# Patient Record
Sex: Male | Born: 1990 | Race: Black or African American | Hispanic: No | Marital: Single | Smoking: Former smoker
Health system: Southern US, Community
[De-identification: ages and names within clinical notes are randomized; demographics above are authoritative.]

## PROBLEM LIST (undated history)

## (undated) DIAGNOSIS — F32A Depression, unspecified: Secondary | ICD-10-CM

## (undated) DIAGNOSIS — R7303 Prediabetes: Secondary | ICD-10-CM

## (undated) DIAGNOSIS — R519 Headache, unspecified: Secondary | ICD-10-CM

## (undated) DIAGNOSIS — F319 Bipolar disorder, unspecified: Secondary | ICD-10-CM

## (undated) DIAGNOSIS — I10 Essential (primary) hypertension: Secondary | ICD-10-CM

## (undated) DIAGNOSIS — F988 Other specified behavioral and emotional disorders with onset usually occurring in childhood and adolescence: Secondary | ICD-10-CM

## (undated) DIAGNOSIS — R569 Unspecified convulsions: Secondary | ICD-10-CM

## (undated) DIAGNOSIS — F329 Major depressive disorder, single episode, unspecified: Secondary | ICD-10-CM

---
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
## 2009-10-15 ENCOUNTER — Emergency Department (HOSPITAL_BASED_OUTPATIENT_CLINIC_OR_DEPARTMENT_OTHER): Admission: EM | Admit: 2009-10-15 | Discharge: 2009-10-15 | Payer: Self-pay | Admitting: Emergency Medicine

## 2009-11-15 ENCOUNTER — Emergency Department (HOSPITAL_COMMUNITY): Admission: EM | Admit: 2009-11-15 | Discharge: 2009-11-16 | Payer: Self-pay | Admitting: Emergency Medicine

## 2009-11-18 ENCOUNTER — Emergency Department (HOSPITAL_COMMUNITY): Admission: AC | Admit: 2009-11-18 | Discharge: 2009-11-19 | Payer: Self-pay

## 2010-07-16 LAB — URINALYSIS, ROUTINE W REFLEX MICROSCOPIC
Glucose, UA: NEGATIVE mg/dL
Hgb urine dipstick: NEGATIVE
Ketones, ur: 15 mg/dL — AB
Protein, ur: 30 mg/dL — AB
Specific Gravity, Urine: 1.039 — ABNORMAL HIGH (ref 1.005–1.030)
pH: 6.5 (ref 5.0–8.0)

## 2010-07-16 LAB — CBC
HCT: 38.5 % — ABNORMAL LOW (ref 39.0–52.0)
Hemoglobin: 13.7 g/dL (ref 13.0–17.0)
MCH: 31.8 pg (ref 26.0–34.0)
MCV: 92 fL (ref 78.0–100.0)
MCV: 93.2 fL (ref 78.0–100.0)
Platelets: 109 10*3/uL — ABNORMAL LOW (ref 150–400)
RBC: 4.13 MIL/uL — ABNORMAL LOW (ref 4.22–5.81)
RDW: 14.9 % (ref 11.5–15.5)
WBC: 3.2 10*3/uL — ABNORMAL LOW (ref 4.0–10.5)
WBC: 3.9 10*3/uL — ABNORMAL LOW (ref 4.0–10.5)

## 2010-07-16 LAB — BASIC METABOLIC PANEL
BUN: 13 mg/dL (ref 6–23)
CO2: 28 mEq/L (ref 19–32)
Calcium: 9.7 mg/dL (ref 8.4–10.5)
Calcium: 9.5 mg/dL (ref 8.4–10.5)
Creatinine, Ser: 0.95 mg/dL (ref 0.4–1.5)
GFR calc Af Amer: >60 mL/min (ref >60)
GFR calc non Af Amer: >60 mL/min (ref >60)
GFR calc non Af Amer: >60 mL/min (ref >60)
Glucose, Bld: 94 mg/dL (ref 70–99)
Potassium: 4.6 mEq/L (ref 3.5–5.1)
Sodium: 141 mEq/L (ref 135–145)

## 2010-07-16 LAB — DIFFERENTIAL
Basophils Absolute: 0 10*3/uL (ref 0.0–0.1)
Basophils Relative: 0 % (ref 0–1)
Eosinophils Absolute: 0.1 10*3/uL (ref 0.0–0.7)
Eosinophils Absolute: 0.2 10*3/uL (ref 0.0–0.7)
Eosinophils Relative: 4 % (ref 0–5)
Lymphocytes Relative: 39 % (ref 12–46)
Monocytes Absolute: 0.3 10*3/uL (ref 0.1–1.0)
Monocytes Relative: 12 % (ref 3–12)
Neutro Abs: 1.5 10*3/uL — ABNORMAL LOW (ref 1.7–7.7)
Neutro Abs: 1.7 10*3/uL (ref 1.7–7.7)
Neutrophils Relative %: 44 % (ref 43–77)

## 2010-07-16 LAB — RAPID URINE DRUG SCREEN, HOSP PERFORMED
Amphetamines: POSITIVE — AB
Barbiturates: NOT DETECTED
Benzodiazepines: NOT DETECTED
Cocaine: NOT DETECTED
Tetrahydrocannabinol: NOT DETECTED

## 2010-07-16 LAB — POCT I-STAT 3, ART BLOOD GAS (G3+)
Acid-Base Excess: 2 mmol/L (ref 0.0–2.0)
O2 Saturation: 96 %
TCO2: 28 mmol/L (ref 0–100)
pCO2 arterial: 42.1 mmHg (ref 35.0–45.0)

## 2010-07-16 LAB — PHENYTOIN LEVEL, TOTAL: Phenytoin Lvl: 0.9 ug/mL — ABNORMAL LOW (ref 10.0–20.0)

## 2010-07-16 LAB — VALPROIC ACID LEVEL: Valproic Acid Lvl: 144.9 ug/mL — ABNORMAL HIGH (ref 50.0–100.0)

## 2010-07-16 LAB — URINE MICROSCOPIC-ADD ON

## 2010-07-17 LAB — URINALYSIS, ROUTINE W REFLEX MICROSCOPIC
Hgb urine dipstick: NEGATIVE
Ketones, ur: NEGATIVE mg/dL
Protein, ur: NEGATIVE mg/dL
Specific Gravity, Urine: 1.013 (ref 1.005–1.030)

## 2010-07-17 LAB — COMPREHENSIVE METABOLIC PANEL
ALT: 16 U/L (ref 0–53)
AST: 27 U/L (ref 0–37)
Albumin: 3.9 g/dL (ref 3.5–5.2)
Alkaline Phosphatase: 65 U/L (ref 39–117)
BUN: 10 mg/dL (ref 6–23)
CO2: 31 mEq/L (ref 19–32)
Calcium: 9.6 mg/dL (ref 8.4–10.5)
Chloride: 105 mEq/L (ref 96–112)
Creatinine, Ser: 0.7 mg/dL (ref 0.4–1.5)
GFR calc Af Amer: >60 mL/min (ref >60)
GFR calc non Af Amer: >60 mL/min (ref >60)
Glucose, Bld: 70 mg/dL (ref 70–99)

## 2010-07-17 LAB — CBC
HCT: 39.6 % (ref 39.0–52.0)
Hemoglobin: 13.3 g/dL (ref 13.0–17.0)
MCHC: 33.7 g/dL (ref 30.0–36.0)
MCV: 91.8 fL (ref 78.0–100.0)
RBC: 4.31 MIL/uL (ref 4.22–5.81)
RDW: 14.2 % (ref 11.5–15.5)

## 2010-07-17 LAB — POCT TOXICOLOGY PANEL

## 2010-07-17 LAB — DIFFERENTIAL
Eosinophils Absolute: 0.1 10*3/uL (ref 0.0–0.7)
Lymphs Abs: 0.9 10*3/uL (ref 0.7–4.0)
Monocytes Absolute: 0.4 10*3/uL (ref 0.1–1.0)
Monocytes Relative: 13 % — ABNORMAL HIGH (ref 3–12)
Neutrophils Relative %: 54 % (ref 43–77)

## 2010-10-26 ENCOUNTER — Other Ambulatory Visit: Payer: Self-pay | Admitting: Family Medicine

## 2010-10-26 ENCOUNTER — Ambulatory Visit
Admission: RE | Admit: 2010-10-26 | Discharge: 2010-10-26 | Disposition: A | Discharge status: Home / Self Care | Payer: Medicaid Other | Source: Ambulatory Visit | Attending: Family Medicine | Admitting: Family Medicine

## 2010-10-26 DIAGNOSIS — R220 Localized swelling, mass and lump, head: Secondary | ICD-10-CM

## 2010-11-17 ENCOUNTER — Emergency Department (HOSPITAL_COMMUNITY)
Admission: EM | Admit: 2010-11-17 | Discharge: 2010-11-17 | Disposition: A | Discharge status: Home / Self Care | Payer: Medicaid Other | Attending: Emergency Medicine | Admitting: Emergency Medicine

## 2010-11-17 DIAGNOSIS — F319 Bipolar disorder, unspecified: Secondary | ICD-10-CM | POA: Insufficient documentation

## 2010-11-17 DIAGNOSIS — F79 Unspecified intellectual disabilities: Secondary | ICD-10-CM | POA: Insufficient documentation

## 2010-11-17 DIAGNOSIS — Z79899 Other long term (current) drug therapy: Secondary | ICD-10-CM | POA: Insufficient documentation

## 2010-11-17 DIAGNOSIS — Z046 Encounter for general psychiatric examination, requested by authority: Secondary | ICD-10-CM | POA: Insufficient documentation

## 2010-11-17 LAB — DIFFERENTIAL
Basophils Relative: 0 % (ref 0–1)
Eosinophils Absolute: 0 10*3/uL (ref 0.0–0.7)
Lymphocytes Relative: 46 % (ref 12–46)
Neutro Abs: 1 10*3/uL — ABNORMAL LOW (ref 1.7–7.7)

## 2010-11-17 LAB — CBC
Hemoglobin: 12.1 g/dL — ABNORMAL LOW (ref 13.0–17.0)
MCH: 29.9 pg (ref 26.0–34.0)
MCV: 84.9 fL (ref 78.0–100.0)
Platelets: 168 10*3/uL (ref 150–400)
RBC: 4.05 MIL/uL — ABNORMAL LOW (ref 4.22–5.81)

## 2010-11-17 LAB — BASIC METABOLIC PANEL
CO2: 27 mEq/L (ref 19–32)
Calcium: 10.2 mg/dL (ref 8.4–10.5)
Creatinine, Ser: 0.86 mg/dL (ref 0.50–1.35)
Glucose, Bld: 80 mg/dL (ref 70–99)

## 2015-03-07 ENCOUNTER — Emergency Department (HOSPITAL_COMMUNITY)
Admission: EM | Admit: 2015-03-07 | Discharge: 2015-03-07 | Disposition: A | Discharge status: Home / Self Care | Payer: Medicaid Other | Attending: Emergency Medicine | Admitting: Emergency Medicine

## 2015-03-07 ENCOUNTER — Encounter (HOSPITAL_COMMUNITY): Payer: Self-pay | Admitting: *Deleted

## 2015-03-07 DIAGNOSIS — R1013 Epigastric pain: Secondary | ICD-10-CM | POA: Diagnosis present

## 2015-03-07 DIAGNOSIS — K2970 Gastritis, unspecified, without bleeding: Secondary | ICD-10-CM | POA: Insufficient documentation

## 2015-03-07 DIAGNOSIS — Z720 Tobacco use: Secondary | ICD-10-CM | POA: Insufficient documentation

## 2015-03-07 LAB — URINALYSIS, ROUTINE W REFLEX MICROSCOPIC
Glucose, UA: NEGATIVE mg/dL
Hgb urine dipstick: NEGATIVE
Ketones, ur: 15 mg/dL — AB
Leukocytes, UA: NEGATIVE
NITRITE: NEGATIVE
PROTEIN: 30 mg/dL — AB
SPECIFIC GRAVITY, URINE: 1.039 — AB (ref 1.005–1.030)
UROBILINOGEN UA: 1 mg/dL (ref 0.0–1.0)
pH: 5.5 (ref 5.0–8.0)

## 2015-03-07 LAB — COMPREHENSIVE METABOLIC PANEL
ALT: 23 U/L (ref 17–63)
ANION GAP: 10 (ref 5–15)
AST: 30 U/L (ref 15–41)
Albumin: 4.2 g/dL (ref 3.5–5.0)
Alkaline Phosphatase: 81 U/L (ref 38–126)
BILIRUBIN TOTAL: 0.8 mg/dL (ref 0.3–1.2)
BUN: 8 mg/dL (ref 6–20)
CO2: 22 mmol/L (ref 22–32)
Calcium: 9.4 mg/dL (ref 8.9–10.3)
Chloride: 107 mmol/L (ref 101–111)
Creatinine, Ser: 0.92 mg/dL (ref 0.61–1.24)
GFR calc Af Amer: >60 mL/min (ref >60)
Glucose, Bld: 139 mg/dL — ABNORMAL HIGH (ref 65–99)
POTASSIUM: 3.6 mmol/L (ref 3.5–5.1)
Sodium: 139 mmol/L (ref 135–145)
TOTAL PROTEIN: 7.5 g/dL (ref 6.5–8.1)

## 2015-03-07 LAB — CBC
HEMATOCRIT: 39.3 % (ref 39.0–52.0)
HEMOGLOBIN: 13.9 g/dL (ref 13.0–17.0)
MCH: 29.4 pg (ref 26.0–34.0)
MCHC: 35.4 g/dL (ref 30.0–36.0)
MCV: 83.1 fL (ref 78.0–100.0)
Platelets: 258 10*3/uL (ref 150–400)
RBC: 4.73 MIL/uL (ref 4.22–5.81)
RDW: 12.9 % (ref 11.5–15.5)
WBC: 6 10*3/uL (ref 4.0–10.5)

## 2015-03-07 LAB — URINE MICROSCOPIC-ADD ON

## 2015-03-07 LAB — LIPASE, BLOOD: Lipase: 18 U/L (ref 11–51)

## 2015-03-07 MED ORDER — PANTOPRAZOLE SODIUM 20 MG PO TBEC: 20.0000 mg | DELAYED_RELEASE_TABLET | Freq: Every day | ORAL | Status: DC

## 2015-03-07 MED ORDER — PANTOPRAZOLE SODIUM 40 MG IV SOLR
40.0000 mg | INTRAVENOUS | Status: AC
  Administered 2015-03-07: 40 mg via INTRAVENOUS
  Filled 2015-03-07: qty 40

## 2015-03-07 MED ORDER — SODIUM CHLORIDE 0.9 % IV BOLUS (SEPSIS)
1000.0000 mL | Freq: Once | INTRAVENOUS | Status: AC
Start: 2015-03-07 — End: 2015-03-07
  Administered 2015-03-07: 1000 mL via INTRAVENOUS

## 2015-03-07 MED ORDER — SODIUM CHLORIDE 0.9 % IV BOLUS (SEPSIS)
1000.0000 mL | Freq: Once | INTRAVENOUS | Status: AC
  Administered 2015-03-07: 1000 mL via INTRAVENOUS

## 2015-03-07 NOTE — ED Provider Notes (Signed)
CSN: [id number]     Arrival date & time 03/07/15  0019 History   First MD Initiated Contact with Patient 03/07/15 0030     Chief Complaint  Patient presents with  . Abdominal Pain     (Consider location/radiation/quality/duration/timing/severity/associated sxs/prior Treatment) HPI Comments: Patient is a 24 year old male who presents with abdominal pain that started a few hours ago after eating fast food. The pain is located in the epigastric area and does not radiate. The pain is described as aching and severe. The pain started gradually and progressively worsened since the onset. No alleviating/aggravating factors. The patient has tried nothing for symptoms without relief. Associated symptoms include nothing. Patient denies fever, headache, NVD, chest pain, SOB, dysuria, constipation.     History reviewed. No pertinent past medical history. History reviewed. No pertinent past surgical history. No family history on file. Social History  Substance Use Topics  . Smoking status: Current Every Day Smoker  . Smokeless tobacco: None  . Alcohol Use: No    Review of Systems  Gastrointestinal: Positive for abdominal pain.  All other systems reviewed and are negative.     Allergies  Review of patient's allergies indicates no known allergies.  Home Medications   Prior to Admission medications   Not on File   BP 132/77 mmHg  Pulse 90  Temp(Src) 97.6 F (36.4 C) (Oral)  Resp 16  Ht 5\' 11"  (1.803 m)  Wt 155 lb (70.308 kg)  BMI 21.63 kg/m2  SpO2 100% Physical Exam  Constitutional: He is oriented to person, place, and time. He appears well-developed and well-nourished. No distress.  HENT:  Head: Normocephalic and atraumatic.  Eyes: Conjunctivae and EOM are normal.  Neck: Normal range of motion.  Cardiovascular: Normal rate and regular rhythm.  Exam reveals no gallop and no friction rub.   No murmur heard. Pulmonary/Chest: Effort normal and breath sounds normal. He has no  wheezes. He has no rales. He exhibits no tenderness.  Abdominal: Soft. He exhibits no distension. There is tenderness. There is no rebound.  Mild epigastric tenderness to palpation. No other focal tenderness to palpation.   Musculoskeletal: Normal range of motion.  Neurological: He is alert and oriented to person, place, and time. Coordination normal.  Speech is goal-oriented. Moves limbs without ataxia.   Skin: Skin is warm and dry.  Psychiatric: He has a normal mood and affect. His behavior is normal.  Nursing note and vitals reviewed.   ED Course  Procedures (including critical care time) Labs Review Labs Reviewed  COMPREHENSIVE METABOLIC PANEL - Abnormal; Notable for the following:    Glucose, Bld 139 (*)    All other components within normal limits  URINALYSIS, ROUTINE W REFLEX MICROSCOPIC (NOT AT [facility]) - Abnormal; Notable for the following:    Color, Urine AMBER (*)    APPearance CLOUDY (*)    Specific Gravity, Urine 1.039 (*)    Bilirubin Urine SMALL (*)    Ketones, ur 15 (*)    Protein, ur 30 (*)    All other components within normal limits  URINE MICROSCOPIC-ADD ON - Abnormal; Notable for the following:    Casts HYALINE CASTS (*)    Crystals CA OXALATE CRYSTALS (*)    All other components within normal limits  LIPASE, BLOOD  CBC    Imaging Review No results found. I have personally reviewed and evaluated these images and lab results as part of my medical decision-making.   EKG Interpretation None  MDM   Final diagnoses:  Gastritis    1:08 AM Labs pending. Patient will have fluids and protonix for pain. Vitals stable and patient afebrile.   Patient feeling better after IV fluids and protonix. Patient likely has gastritis and will be discharged with protonix. Vitals stable and patient afebrile.     Emilia Beck, PA-C 03/07/15 1610  Tomasita Crumble, MD 03/07/15 [phone]

## 2015-03-07 NOTE — Discharge Instructions (Signed)
Take protonix as needed for abdominal pain. Refer to attached documents for more information.

## 2015-03-07 NOTE — ED Notes (Signed)
The pt is c/o abd pain since yesterday no n v or diarrhea  °

## 2015-11-01 ENCOUNTER — Emergency Department (HOSPITAL_COMMUNITY)
Admission: EM | Admit: 2015-11-01 | Discharge: 2015-11-01 | Disposition: A | Discharge status: Home / Self Care | Payer: Medicaid Other | Attending: Emergency Medicine | Admitting: Emergency Medicine

## 2015-11-01 ENCOUNTER — Encounter (HOSPITAL_COMMUNITY): Payer: Self-pay | Admitting: Emergency Medicine

## 2015-11-01 DIAGNOSIS — F17200 Nicotine dependence, unspecified, uncomplicated: Secondary | ICD-10-CM | POA: Diagnosis not present

## 2015-11-01 DIAGNOSIS — Z113 Encounter for screening for infections with a predominantly sexual mode of transmission: Secondary | ICD-10-CM | POA: Insufficient documentation

## 2015-11-01 DIAGNOSIS — Z139 Encounter for screening, unspecified: Secondary | ICD-10-CM

## 2015-11-01 NOTE — ED Notes (Signed)
Patient wants to be checked for STD's so he can return back to his program. Program want take him back until he is checked.

## 2015-11-01 NOTE — ED Notes (Signed)
PT DISCHARGED. INSTRUCTIONS GIVEN. AAOX4. PT IN NO APPARENT DISTRESS OR PAIN. THE OPPORTUNITY TO ASK QUESTIONS WAS PROVIDED. 

## 2015-11-01 NOTE — ED Provider Notes (Signed)
CSN: [id number]     Arrival date & time 11/01/15  1903 History  By signing my name below, I, Jonathon House, attest that this documentation has been prepared under the direction and in the presence of [employer] Tymeir [place], PA. Electronically Signed: Alyssa GroveMartin House, ED Scribe. 11/01/2015. 8:32 PM.    Chief Complaint  Patient presents with  . SEXUALLY TRANSMITTED DISEASE    The history is provided by a caregiver. No language interpreter was used.  Via his [facility] provider  HPI Comments: Jonathon House is a 25 y.o. male with who presents to the Emergency Department requesting a medical clearance for program that pt will be starting 11/02/2015. AFL provider states pt requires a wellness screening including an STD screening before attending program. AFL provider states he eloped from the house for a couple of days. AFL provider states that pt has unclear PMHx of ADHD, Bipolar disorder, and Schizophrenia and is not sure of any additional PMHx. No medical history or background could be provided by AFL provider or information concerning what type of clearance is necessary or which [place]  History reviewed. No pertinent past medical history. History reviewed. No pertinent past surgical history. History reviewed. No pertinent family history. Social History  Substance Use Topics  . Smoking status: Current Every Day Smoker  . Smokeless tobacco: None  . Alcohol Use: No    Review of Systems A complete 10 system review of systems was obtained and all systems are negative except as noted in the HPI and PMH.    Allergies  Shellfish allergy  Home Medications   Prior to Admission medications   Medication Sig Start Date End Date Taking? Authorizing Provider  pantoprazole (PROTONIX) 20 MG tablet Take 1 tablet (20 mg total) by mouth daily. 03/07/15   Kaitlyn Szekalski, PA-C   BP 152/84 mmHg  Pulse 81  Temp(Src) 98.3 F (36.8 C) (Oral)  Resp 17  Ht 6' (1.829 m)  Wt 133  lb 4.8 oz (60.464 kg)  BMI 18.07 kg/m2  SpO2 100% Physical Exam  Constitutional: He appears well-developed and well-nourished. No distress.  HENT:  Head: Normocephalic and atraumatic.  Mouth/Throat: Oropharynx is clear and moist.  Eyes: Conjunctivae and EOM are normal. Pupils are equal, round, and reactive to light.  Neck: Normal range of motion.  Cardiovascular: Normal rate, regular rhythm and intact distal pulses.   Pulmonary/Chest: Effort normal and breath sounds normal.  Abdominal: Soft. There is no tenderness.  Musculoskeletal: Normal range of motion.  Neurological: He is alert.  Skin: He is not diaphoretic.  Psychiatric: He has a normal mood and affect.  Nursing note and vitals reviewed.   ED Course  Procedures (including critical care time)  DIAGNOSTIC STUDIES: Oxygen Saturation is 100% on RA, normal by my interpretation.    Labs Review Labs Reviewed  GC/CHLAMYDIA PROBE AMP ([place]) NOT AT [facility]   I have personally reviewed and evaluated these images and lab results as part of my medical decision-making.   EKG Interpretation None      MDM   Final diagnoses:  Encounter for medical screening examination    Filed Vitals:   11/01/15 1921 11/01/15 1923  BP: 152/84   Pulse: 81   Temp: 98.3 F (36.8 C)   TempSrc: Oral   Resp: 17   Height: 6' (1.829 m)   Weight: 60.328 kg 60.464 kg  SpO2: 100%     Jonathon BenderRonnie Gowell is 25 y.o. male requesting medical clearance, his  medical background is unknown, he is not answering questions, his care provider is answering questions however she is unclear what medications he takes and what his previous diagnoses are she thinks it may be schizophrenia and PTSD. I am not clear what program this patient is going to an what clearance is required, since I don't have this patient's background I cannot clear him I displace and they will need to follow with primary care for clearance. I've offered them STD testing however since patient  is asymptomatic they have declined.  Evaluation does not show pathology that would require ongoing emergent intervention or inpatient treatment. Pt is hemodynamically stable and mentating appropriately. Discussed findings and plan with patient/guardian, who agrees with care plan. All questions answered. Return precautions discussed and outpatient follow up given.    I personally performed the services described in this documentation, which was scribed in my presence. The recorded information has been reviewed and is accurate.   Wynetta Emeryicole Emmelyn Schmale, PA-C 11/01/15 2045  Leta BaptistEmily Roe Nguyen, MD 11/04/15 [phone]

## 2015-11-01 NOTE — Discharge Instructions (Signed)
Please follow with your primary care doctor in the next 2 days for a check-up. They must obtain records for further management.  ° °Do not hesitate to return to the Emergency Department for any new, worsening or concerning symptoms.  ° °

## 2015-11-18 ENCOUNTER — Ambulatory Visit (HOSPITAL_COMMUNITY)
Admission: EM | Admit: 2015-11-18 | Discharge: 2015-11-18 | Disposition: A | Discharge status: Home / Self Care | Payer: Medicaid Other | Attending: Emergency Medicine | Admitting: Emergency Medicine

## 2015-11-18 ENCOUNTER — Encounter (HOSPITAL_COMMUNITY): Payer: Self-pay | Admitting: Emergency Medicine

## 2015-11-18 DIAGNOSIS — Z043 Encounter for examination and observation following other accident: Secondary | ICD-10-CM

## 2015-11-18 DIAGNOSIS — Z711 Person with feared health complaint in whom no diagnosis is made: Secondary | ICD-10-CM

## 2015-11-18 NOTE — Discharge Instructions (Signed)
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
## 2015-12-16 ENCOUNTER — Encounter (HOSPITAL_COMMUNITY): Payer: Self-pay | Admitting: Emergency Medicine

## 2015-12-16 ENCOUNTER — Emergency Department (HOSPITAL_COMMUNITY)
Admission: EM | Admit: 2015-12-16 | Discharge: 2015-12-16 | Disposition: A | Discharge status: Home / Self Care | Payer: Medicaid Other | Attending: Emergency Medicine | Admitting: Emergency Medicine

## 2015-12-16 DIAGNOSIS — Z79899 Other long term (current) drug therapy: Secondary | ICD-10-CM | POA: Diagnosis not present

## 2015-12-16 DIAGNOSIS — F17210 Nicotine dependence, cigarettes, uncomplicated: Secondary | ICD-10-CM | POA: Insufficient documentation

## 2015-12-16 DIAGNOSIS — Z046 Encounter for general psychiatric examination, requested by authority: Secondary | ICD-10-CM | POA: Diagnosis present

## 2015-12-16 DIAGNOSIS — R259 Unspecified abnormal involuntary movements: Secondary | ICD-10-CM | POA: Diagnosis not present

## 2015-12-16 DIAGNOSIS — F909 Attention-deficit hyperactivity disorder, unspecified type: Secondary | ICD-10-CM | POA: Insufficient documentation

## 2015-12-16 HISTORY — DX: Other specified behavioral and emotional disorders with onset usually occurring in childhood and adolescence: F98.8

## 2015-12-16 LAB — COMPREHENSIVE METABOLIC PANEL
ALT: 22 U/L (ref 17–63)
ANION GAP: 7 (ref 5–15)
AST: 38 U/L (ref 15–41)
Albumin: 4.4 g/dL (ref 3.5–5.0)
Alkaline Phosphatase: 72 U/L (ref 38–126)
BUN: 9 mg/dL (ref 6–20)
CALCIUM: 8.9 mg/dL (ref 8.9–10.3)
CHLORIDE: 105 mmol/L (ref 101–111)
CO2: 23 mmol/L (ref 22–32)
Creatinine, Ser: 0.87 mg/dL (ref 0.61–1.24)
GFR calc non Af Amer: >60 mL/min (ref >60)
Glucose, Bld: 87 mg/dL (ref 65–99)
POTASSIUM: 3.2 mmol/L — AB (ref 3.5–5.1)
SODIUM: 135 mmol/L (ref 135–145)
Total Bilirubin: 0.8 mg/dL (ref 0.3–1.2)
Total Protein: 7.5 g/dL (ref 6.5–8.1)

## 2015-12-16 LAB — RAPID URINE DRUG SCREEN, HOSP PERFORMED
Amphetamines: NOT DETECTED
BARBITURATES: NOT DETECTED
Benzodiazepines: NOT DETECTED
COCAINE: NOT DETECTED
Opiates: NOT DETECTED
TETRAHYDROCANNABINOL: NOT DETECTED

## 2015-12-16 LAB — CBC
HEMATOCRIT: 41.3 % (ref 39.0–52.0)
HEMOGLOBIN: 14.2 g/dL (ref 13.0–17.0)
MCH: 29.2 pg (ref 26.0–34.0)
MCHC: 34.4 g/dL (ref 30.0–36.0)
MCV: 84.8 fL (ref 78.0–100.0)
Platelets: 240 10*3/uL (ref 150–400)
RBC: 4.87 MIL/uL (ref 4.22–5.81)
RDW: 13.1 % (ref 11.5–15.5)
WBC: 4.9 10*3/uL (ref 4.0–10.5)

## 2015-12-16 LAB — SALICYLATE LEVEL: Salicylate Lvl: <4 mg/dL (ref 2.8–30.0)

## 2015-12-16 LAB — ETHANOL: Alcohol, Ethyl (B): <5 mg/dL (ref <5)

## 2015-12-16 LAB — ACETAMINOPHEN LEVEL: Acetaminophen (Tylenol), Serum: <10 ug/mL — ABNORMAL LOW (ref 10–30)

## 2015-12-16 MED ORDER — IBUPROFEN 200 MG PO TABS: 600.0000 mg | ORAL_TABLET | Freq: Three times a day (TID) | ORAL | Status: DC | PRN

## 2015-12-16 MED ORDER — ALUM & MAG HYDROXIDE-SIMETH 200-200-20 MG/5ML PO SUSP: 30.0000 mL | ORAL | Status: DC | PRN

## 2015-12-16 MED ORDER — LORAZEPAM 1 MG PO TABS: 1.0000 mg | ORAL_TABLET | Freq: Three times a day (TID) | ORAL | Status: DC | PRN

## 2015-12-16 MED ORDER — ACETAMINOPHEN 325 MG PO TABS: 650.0000 mg | ORAL_TABLET | ORAL | Status: DC | PRN

## 2015-12-16 MED ORDER — NICOTINE 21 MG/24HR TD PT24: 21.0000 mg | MEDICATED_PATCH | Freq: Every day | TRANSDERMAL | Status: DC

## 2015-12-16 MED ORDER — LORAZEPAM 1 MG PO TABS: 0.0000 mg | ORAL_TABLET | Freq: Four times a day (QID) | ORAL | Status: DC

## 2015-12-16 MED ORDER — LORAZEPAM 1 MG PO TABS: 0.0000 mg | ORAL_TABLET | Freq: Two times a day (BID) | ORAL | Status: DC

## 2015-12-16 MED ORDER — ONDANSETRON HCL 4 MG PO TABS: 4.0000 mg | ORAL_TABLET | Freq: Three times a day (TID) | ORAL | Status: DC | PRN
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
## 2016-06-25 ENCOUNTER — Emergency Department (HOSPITAL_COMMUNITY): Payer: Medicaid Other

## 2016-06-25 ENCOUNTER — Emergency Department (HOSPITAL_COMMUNITY)
Admission: EM | Admit: 2016-06-25 | Discharge: 2016-06-25 | Disposition: A | Discharge status: Home / Self Care | Payer: Medicaid Other | Attending: Emergency Medicine | Admitting: Emergency Medicine

## 2016-06-25 DIAGNOSIS — Y929 Unspecified place or not applicable: Secondary | ICD-10-CM | POA: Insufficient documentation

## 2016-06-25 DIAGNOSIS — Y999 Unspecified external cause status: Secondary | ICD-10-CM | POA: Insufficient documentation

## 2016-06-25 DIAGNOSIS — M79602 Pain in left arm: Secondary | ICD-10-CM

## 2016-06-25 DIAGNOSIS — F17210 Nicotine dependence, cigarettes, uncomplicated: Secondary | ICD-10-CM | POA: Diagnosis not present

## 2016-06-25 DIAGNOSIS — S0990XA Unspecified injury of head, initial encounter: Secondary | ICD-10-CM | POA: Insufficient documentation

## 2016-06-25 DIAGNOSIS — Z79899 Other long term (current) drug therapy: Secondary | ICD-10-CM | POA: Diagnosis not present

## 2016-06-25 DIAGNOSIS — Y939 Activity, unspecified: Secondary | ICD-10-CM | POA: Diagnosis not present

## 2016-06-25 MED ORDER — ACETAMINOPHEN 500 MG PO TABS
1000.0000 mg | ORAL_TABLET | Freq: Once | ORAL | Status: AC
  Administered 2016-06-25: 1000 mg via ORAL
  Filled 2016-06-25: qty 2

## 2016-06-25 NOTE — ED Notes (Signed)
Taken to xray at this time. 
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
## 2016-06-25 NOTE — Discharge Instructions (Signed)
If you were given medicines take as directed.  If you are on coumadin or contraceptives realize their levels and effectiveness is altered by many different medicines.  If you have any reaction (rash, tongues swelling, other) to the medicines stop taking and see a physician.    If your blood pressure was elevated in the ER make sure you follow up for management with a primary doctor or return for chest pain, shortness of breath or stroke symptoms.  Please follow up as directed and return to the ER or see a physician for new or worsening symptoms.  Thank you. Vitals:   06/25/16 1817  Weight: 130 lb (59 kg)  Height: 6' (1.829 m)
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
## 2016-10-24 ENCOUNTER — Telehealth: Payer: Self-pay | Admitting: *Deleted

## 2016-10-24 NOTE — Telephone Encounter (Signed)
09/11/16 Received a call from case manager for patient Mr Jonathon [facility] he advised the patient was needing a visit and asked that I schedule him. Asked what he needed to be seen for and was advised HIV. Asked to speak with the patient and was told he was incarcerated and could not speak but that he could send me a release.   After received the release called Mr Jonathon House back and was told that the [facility] had been [facility] [place] [facility]. Asked if they had any records on him and was told no. Asked him to have the patient sign a release and send to [facility] for office notes, meds, labs and geno as well as proof of positivity. Once we receive the records will call to schedule an appt.  Mr Jonathon House faxed me the release and asked that I send [facility] to [facility]. I did this on 09/11/16 twice and still have not gotten a response. They have yet to send me any information.   Someone from Mr Jonathon House office called 10/09/16 to ask what labs we would need and Annice PihJackie CMA told them and I have called Mr Jonathon House several times and not gotten a response will wait for then to follow up.

## 2017-07-26 ENCOUNTER — Emergency Department (HOSPITAL_COMMUNITY)
Admission: EM | Admit: 2017-07-26 | Discharge: 2017-07-27 | Disposition: A | Discharge status: Home / Self Care | Payer: Medicaid Other | Attending: Emergency Medicine | Admitting: Emergency Medicine

## 2017-07-26 ENCOUNTER — Encounter (HOSPITAL_COMMUNITY): Payer: Self-pay | Admitting: *Deleted

## 2017-07-26 ENCOUNTER — Other Ambulatory Visit: Payer: Self-pay

## 2017-07-26 DIAGNOSIS — F4321 Adjustment disorder with depressed mood: Secondary | ICD-10-CM | POA: Insufficient documentation

## 2017-07-26 DIAGNOSIS — F3181 Bipolar II disorder: Secondary | ICD-10-CM | POA: Diagnosis not present

## 2017-07-26 DIAGNOSIS — Z79899 Other long term (current) drug therapy: Secondary | ICD-10-CM | POA: Diagnosis not present

## 2017-07-26 DIAGNOSIS — R454 Irritability and anger: Secondary | ICD-10-CM

## 2017-07-26 DIAGNOSIS — F329 Major depressive disorder, single episode, unspecified: Secondary | ICD-10-CM | POA: Diagnosis present

## 2017-07-26 DIAGNOSIS — F17210 Nicotine dependence, cigarettes, uncomplicated: Secondary | ICD-10-CM | POA: Diagnosis not present

## 2017-07-26 DIAGNOSIS — F419 Anxiety disorder, unspecified: Secondary | ICD-10-CM | POA: Diagnosis not present

## 2017-07-26 LAB — COMPREHENSIVE METABOLIC PANEL
ALT: 22 U/L (ref 17–63)
ANION GAP: 5 (ref 5–15)
AST: 27 U/L (ref 15–41)
Albumin: 3.7 g/dL (ref 3.5–5.0)
Alkaline Phosphatase: 62 U/L (ref 38–126)
BILIRUBIN TOTAL: 0.3 mg/dL (ref 0.3–1.2)
BUN: 9 mg/dL (ref 6–20)
CO2: 28 mmol/L (ref 22–32)
Calcium: 9.3 mg/dL (ref 8.9–10.3)
Chloride: 108 mmol/L (ref 101–111)
Creatinine, Ser: 0.66 mg/dL (ref 0.61–1.24)
GFR calc Af Amer: >60 mL/min (ref >60)
GFR calc non Af Amer: >60 mL/min (ref >60)
Glucose, Bld: 149 mg/dL — ABNORMAL HIGH (ref 65–99)
POTASSIUM: 3.9 mmol/L (ref 3.5–5.1)
Sodium: 141 mmol/L (ref 135–145)
TOTAL PROTEIN: 6.8 g/dL (ref 6.5–8.1)

## 2017-07-26 LAB — RAPID URINE DRUG SCREEN, HOSP PERFORMED
Amphetamines: NOT DETECTED
Barbiturates: NOT DETECTED
Benzodiazepines: NOT DETECTED
COCAINE: NOT DETECTED
OPIATES: NOT DETECTED
TETRAHYDROCANNABINOL: NOT DETECTED

## 2017-07-26 LAB — CBC
HCT: 37.8 % — ABNORMAL LOW (ref 39.0–52.0)
HEMOGLOBIN: 12.9 g/dL — AB (ref 13.0–17.0)
MCH: 30.3 pg (ref 26.0–34.0)
MCHC: 34.1 g/dL (ref 30.0–36.0)
MCV: 88.7 fL (ref 78.0–100.0)
PLATELETS: 265 10*3/uL (ref 150–400)
RBC: 4.26 MIL/uL (ref 4.22–5.81)
RDW: 13.5 % (ref 11.5–15.5)
WBC: 3.4 10*3/uL — ABNORMAL LOW (ref 4.0–10.5)

## 2017-07-26 LAB — ETHANOL

## 2017-07-26 MED ORDER — LORAZEPAM 1 MG PO TABS: 0.0000 mg | ORAL_TABLET | Freq: Four times a day (QID) | ORAL | Status: DC

## 2017-07-26 MED ORDER — ACETAMINOPHEN 325 MG PO TABS: 650.0000 mg | ORAL_TABLET | ORAL | Status: DC | PRN

## 2017-07-26 MED ORDER — DIVALPROEX SODIUM ER 250 MG PO TB24
250.0000 mg | ORAL_TABLET | Freq: Two times a day (BID) | ORAL | Status: DC
  Administered 2017-07-26 – 2017-07-27 (×2): 250 mg via ORAL
  Filled 2017-07-26 (×2): qty 1

## 2017-07-26 MED ORDER — LORAZEPAM 2 MG/ML IJ SOLN: 0.0000 mg | Freq: Four times a day (QID) | INTRAMUSCULAR | Status: DC

## 2017-07-26 MED ORDER — THIAMINE HCL 100 MG/ML IJ SOLN: 100.0000 mg | Freq: Every day | INTRAMUSCULAR | Status: DC

## 2017-07-26 MED ORDER — LORAZEPAM 1 MG PO TABS: 0.0000 mg | ORAL_TABLET | Freq: Two times a day (BID) | ORAL | Status: DC

## 2017-07-26 MED ORDER — VITAMIN B-1 100 MG PO TABS
100.0000 mg | ORAL_TABLET | Freq: Every day | ORAL | Status: DC
  Administered 2017-07-26: 100 mg via ORAL
  Filled 2017-07-26: qty 1

## 2017-07-26 MED ORDER — HYDROXYZINE HCL 25 MG PO TABS: 25.0000 mg | ORAL_TABLET | Freq: Three times a day (TID) | ORAL | Status: DC | PRN

## 2017-07-26 MED ORDER — OLANZAPINE 5 MG PO TABS
5.0000 mg | ORAL_TABLET | Freq: Every day | ORAL | Status: DC
  Administered 2017-07-26: 5 mg via ORAL
  Filled 2017-07-26: qty 1

## 2017-07-26 MED ORDER — LORAZEPAM 2 MG/ML IJ SOLN
0.0000 mg | Freq: Two times a day (BID) | INTRAMUSCULAR | Status: DC
Start: 2017-07-28 — End: 2017-07-27

## 2017-07-26 NOTE — BH Assessment (Signed)
Assessment Note  Jonathon BenderRonnie House is an 27 y.o. male. Pt denies SI/HI and AVH. Pt states he has increased anxiety and depression. Pt states his psychiatric needs are worsening due to homelessness. Pt has an guardian who he is not in contact with. Pt's guardian is Jonathon [facility]le [facility] with [facility]. Pt was brought in by Jonathon Foodsussell House. Mr. Jonathon House states that he provided Day Treatment to the Pt "years ago." Per Mr. Jonathon House the Pt showed up at his Day Treatment program requesting help with his mental health needs. Mr. Jonathon House states that he is concerned that the Pt is on the "streets" without any mental health treatment. Mr. Jonathon House also states that he is concerned about the Pt's capacity to take care of himself.  Pt has some multiple charges pending.   Jonathon ConesLaurie, NP recommends overnight observation to assess for safety and stabilization.   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 ADD (attention deficit disorder)     History reviewed. No pertinent surgical history.  Family History: No family history on file.  Social History:  reports that he has been smoking cigarettes.  He has been smoking about 1.00 pack per day. He has never used smokeless tobacco. He reports that he drinks alcohol. He reports that he has current or past drug history. Drug: Marijuana.  Additional Social History:  Alcohol / Drug Use Pain Medications: please see mar Prescriptions: please see mar Over the Counter: please see mar History of alcohol / drug use?: Yes Longest period of sobriety (when/how long): unknown Substance #1 Name of Substance 1: marijuana 1 - Age of First Use: unknown 1 - Amount (size/oz): unknown 1 - Frequency: occasional 1 - Duration: ongoing 1 - Last Use / Amount: 07/26/17  CIWA: CIWA-Ar BP: 139/69 Pulse Rate: 83 Nausea and Vomiting: no nausea and no vomiting Tactile Disturbances: none Tremor: no tremor Auditory Disturbances: not present Paroxysmal  Sweats: no sweat visible Visual Disturbances: not present Anxiety: three Headache, Fullness in Head: very mild Agitation: normal activity Orientation and Clouding of Sensorium: oriented and can do serial additions CIWA-Ar Total: 4 COWS:    Allergies:  Allergies  Allergen Reactions  . Shellfish Allergy Anaphylaxis    Home Medications:  (Not in a hospital admission)  OB/GYN Status:  No LMP for male patient.  General Assessment Data Location of Assessment: [facility] ED TTS Assessment: In system Is this a Tele or Face-to-Face Assessment?: Face-to-Face Is this an Initial Assessment or a Re-assessment for this encounter?: Initial Assessment Marital status: Single Maiden name: NA Is patient pregnant?: No Pregnancy Status: No Living Arrangements: Other (Comment)(homeless) Can pt return to current living arrangement?: Yes Admission Status: Voluntary Is patient capable of signing voluntary admission?: Yes Referral Source: Self/Family/Friend Insurance type: Medicaid     Crisis Care Plan Living Arrangements: Other (Comment)(homeless) Legal Guardian: Other:(Jonathon House) Name of Psychiatrist: NA Name of Therapist: NA  Education Status Is patient currently in school?: No Is the patient employed, unemployed or receiving disability?: Unemployed  Risk to self with the past 6 months Suicidal Ideation: No Has patient been a risk to self within the past 6 months prior to admission? : No Suicidal Intent: No Has patient had any suicidal intent within the past 6 months prior to admission? : No Is patient at risk for suicide?: No Suicidal Plan?: No Has patient had any suicidal plan within the past 6 months prior to admission? : No Access to Means: No What has  been your use of drugs/alcohol within the last 12 months?: marijuna Previous Attempts/Gestures: No How many times?: 0 Other Self Harm Risks: NA Triggers for Past Attempts: None known Intentional Self Injurious  Behavior: None Family Suicide History: No Recent stressful life event(s): Other (Comment)(homelessness) Persecutory voices/beliefs?: No Depression: Yes Depression Symptoms: Isolating, Loss of interest in usual pleasures, Feeling worthless/self pity Substance abuse history and/or treatment for substance abuse?: No Suicide prevention information given to non-admitted patients: Not applicable  Risk to Others within the past 6 months Homicidal Ideation: No Does patient have any lifetime risk of violence toward others beyond the six months prior to admission? : No Thoughts of Harm to Others: No Current Homicidal Intent: No Current Homicidal Plan: No Access to Homicidal Means: No Identified Victim: NA History of harm to others?: No Assessment of Violence: None Noted Violent Behavior Description: NA Does patient have access to weapons?: No Criminal Charges Pending?: No Does patient have a court date: No Is patient on probation?: No  Psychosis Hallucinations: None noted Delusions: None noted  Mental Status Report Appearance/Hygiene: Unremarkable, In scrubs Eye Contact: Fair Motor Activity: Freedom of movement Speech: Logical/coherent Level of Consciousness: Alert Mood: Sad Affect: Sad Anxiety Level: Minimal Thought Processes: Coherent, Relevant Judgement: Impaired Orientation: Person, Place, Time, Situation Obsessive Compulsive Thoughts/Behaviors: None  Cognitive Functioning Concentration: Normal Memory: Recent Intact, Remote Intact Is patient IDD: No Is patient DD?: No Insight: Poor Impulse Control: Poor Appetite: Fair Have you had any weight changes? : No Change Sleep: Decreased Total Hours of Sleep: 5 Vegetative Symptoms: None  ADLScreening [facility] Assessment Services) Patient's cognitive ability adequate to safely complete daily activities?: No Patient able to express need for assistance with ADLs?: Yes Independently performs ADLs?: Yes (appropriate for  developmental age)  Prior Inpatient Therapy Prior Inpatient Therapy: Yes Prior Therapy Dates: unknown Prior Therapy Facilty/Provider(s): unknown Reason for Treatment: bipolar  Prior Outpatient Therapy Prior Outpatient Therapy: Yes Prior Therapy Dates: unknown Prior Therapy Facilty/Provider(s): [facility] Reason for Treatment: bipolar Does patient have an ACCT team?: No Does patient have Intensive In-House Services?  : No Does patient have [facility] services? : No Does patient have P4CC services?: No  ADL Screening (condition at time of admission) Patient's cognitive ability adequate to safely complete daily activities?: No Is the patient deaf or have difficulty hearing?: No Does the patient have difficulty seeing, even when wearing glasses/contacts?: No Does the patient have difficulty concentrating, remembering, or making decisions?: Yes Patient able to express need for assistance with ADLs?: Yes Does the patient have difficulty dressing or bathing?: No Independently performs ADLs?: Yes (appropriate for developmental age)       Abuse/Neglect Assessment (Assessment to be complete while patient is alone) Abuse/Neglect Assessment Can Be Completed: Yes Physical Abuse: Denies Verbal Abuse: Denies Sexual Abuse: Denies Exploitation of patient/patient's resources: Denies     Merchant navy officer (For Healthcare) Does Patient Have a Medical Advance Directive?: No Would patient like information on creating a medical advance directive?: No - Patient declined    Additional Information 1:1 In Past 12 Months?: No CIRT Risk: No Elopement Risk: No Does patient have medical clearance?: Yes     Disposition:  Disposition Initial Assessment Completed for this Encounter: Yes  On Site Evaluation by:   Reviewed with Physician:    Emmit Pomfret 07/26/2017 4:36 PM

## 2017-07-26 NOTE — ED Notes (Signed)
Pt stated "I grew up in [place], [place] but moved here because I had an uncle here.  I graduated from [employer] in 2010.  I haven't seen anyone for depression in a couple of years.  I'm here to get help getting some medicine."  Pt denies SI/HI, A/V hallucinations.
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
## 2017-07-26 NOTE — ED Notes (Signed)
Patient denies SI/HI/AVH. Plan of care discussed. Encouragement and support provided and safety maintain. Q 15 min safety checks remain in place and video monitoring.  

## 2017-07-26 NOTE — Progress Notes (Addendum)
  Patient ID: Jonathon BenderRonnie House, male   DOB: 02/17/1991, 27 y.o.   MRN: [id number]  Pt was seen and chart reviewed with treatment team. Pt will be held overnight for safety and medication effectiveness. Pt started on Depakote ER 250 mg BID and Zyprexa 5 mg PO QHS for mood stabilization. Pt will be seen by psychiatry in the [facility]  Laveda AbbeLaurie Britton Parks, NP-C 07-26-2017       [phone]     Patient's chart reviewed and case discussed with the physician extender and developed treatment plan. Reviewed the information documented and agree with the treatment plan.  Juanetta BeetsJacqueline Giavonni Cizek, DO 07/26/17 11:09 PM
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
## 2017-07-26 NOTE — Progress Notes (Signed)
CSW met with pt to offer resources on homelessness and pt identified that substances was "part of the problem". CSW provided education to the pt as to the efficacy of 12-step programs for community support for those needing support in addition to or other than inpatient/outpatient treatment and provided pt with list of [employer] in [place] and educated the pt on making contact, assessing for open beds and requesting an interview/assessment for admission. Pt appreciated CSW's efforts and thanked the CSW. CSW also offered pt a shelter list for the [place] area.  Please reconsult if future social work needs arise.  CSW signing off, as social work intervention is no longer needed.  Alphonse Guild. Kiandra Sanguinetti, LCSW, LCAS, CSI Clinical Social Worker Ph: [phone]
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
## 2017-07-27 DIAGNOSIS — F419 Anxiety disorder, unspecified: Secondary | ICD-10-CM

## 2017-07-27 DIAGNOSIS — F1290 Cannabis use, unspecified, uncomplicated: Secondary | ICD-10-CM

## 2017-07-27 DIAGNOSIS — F4321 Adjustment disorder with depressed mood: Secondary | ICD-10-CM | POA: Diagnosis not present

## 2017-07-27 DIAGNOSIS — R454 Irritability and anger: Secondary | ICD-10-CM

## 2017-07-27 DIAGNOSIS — F17210 Nicotine dependence, cigarettes, uncomplicated: Secondary | ICD-10-CM | POA: Diagnosis not present

## 2017-07-27 MED ORDER — OLANZAPINE 5 MG PO TABS: 5.0000 mg | ORAL_TABLET | Freq: Every day | ORAL | 0 refills | Status: DC

## 2017-07-27 MED ORDER — DIVALPROEX SODIUM ER 250 MG PO TB24: 250.0000 mg | ORAL_TABLET | Freq: Two times a day (BID) | ORAL | 0 refills | Status: DC

## 2017-07-27 MED ORDER — HYDROXYZINE HCL 25 MG PO TABS: 25.0000 mg | ORAL_TABLET | Freq: Three times a day (TID) | ORAL | 0 refills | Status: DC | PRN

## 2017-07-27 NOTE — BHH Suicide Risk Assessment (Signed)
Suicide Risk Assessment  Discharge Assessment   [facility]   Principal Problem: Adjustment disorder with depressed mood Discharge Diagnoses:  Patient Active Problem List   Diagnosis Date Noted  . Adjustment disorder with depressed mood [F43.21] 07/27/2017    Priority: High    Total Time spent with patient: 45 minutes  Musculoskeletal: Strength & Muscle Tone: within normal limits Gait & Station: normal Patient leans: N/A  Psychiatric Specialty Exam: Physical Exam  Constitutional: He is oriented to person, place, and time. He appears well-developed and well-nourished.  HENT:  Head: Normocephalic.  Neck: Normal range of motion.  Respiratory: Effort normal.  Musculoskeletal: Normal range of motion.  Neurological: He is alert and oriented to person, place, and time.  Psychiatric: His speech is normal and behavior is normal. Judgment and thought content normal. Cognition and memory are normal. He exhibits a depressed mood.    Review of Systems  Psychiatric/Behavioral: Positive for depression.  All other systems reviewed and are negative.   Blood pressure (!) 103/55, pulse 73, temperature 98.6 F (37 C), temperature source Oral, resp. rate 14, height 6' (1.829 m), weight 72.6 kg (160 lb), SpO2 96 %.Body mass index is 21.7 kg/m.  General Appearance: Casual  Eye Contact:  Good  Speech:  Normal Rate  Volume:  Normal  Mood:  Depressed, mild, situational  Affect:  Appropriate  Thought Process:  Coherent and Descriptions of Associations: Intact  Orientation:  Full (Time, Place, and Person)  Thought Content:  WDL and Logical  Suicidal Thoughts:  No  Homicidal Thoughts:  No  Memory:  Immediate;   Good Recent;   Good Remote;   Good  Judgement:  Fair  Insight:  Fair  Psychomotor Activity:  Normal  Concentration:  Concentration: Good and Attention Span: Good  Recall:  Good  Fund of Knowledge:  Fair  Language:  Good  Akathisia:  No  Handed:  Right   AIMS (if indicated):     Assets:  Leisure Time Physical Health Resilience Social Support  ADL's:  Intact  Cognition:  Impaired,  Mild  Sleep:      Mental Status Per Nursing Assessment::   On Admission:   suicidal ideations  Demographic Factors:  Male and Adolescent or young adult  Loss Factors: NA  Historical Factors: NA  Risk Reduction Factors:   Sense of responsibility to family, Positive social support and Positive therapeutic relationship  Continued Clinical Symptoms:  Depression, mild  Cognitive Features That Contribute To Risk:  None    Suicide Risk:  Minimal: No identifiable suicidal ideation.  Patients presenting with no risk factors but with morbid ruminations; may be classified as minimal risk based on the severity of the depressive symptoms    Plan Of Care/Follow-up recommendations:  Activity:  as tolerated Diet:  heart healthy diet  Jonathon Tristan, NP 07/27/2017, 9:57 AM

## 2017-07-27 NOTE — Patient Outreach (Signed)
CPSS met with the patient and provided substance recovery support. Patient said he was interested in getting help for substance use with alcohol and marijuana. CPSS provided and explained information about each resource to the patient including AA/NA meeting list, list of residential/outpatient substance use treatment centers, and CPSS contact information. CPSS encouraged the patient to contact CPSS at anytime for substance use recovery support or help with substance use recovery resources.

## 2017-07-27 NOTE — BH Assessment (Addendum)
[facility]  Per Juanetta BeetsJacqueline Norman, DO, this pt does not require psychiatric hospitalization at this time.  Pt reportedly has a guardian, Roxana HiresMichelle Juchatz, with [facility] [place] [facility].  Stacy GardnerErin Davenport, LCSW, has been trying to reach her during the course of the past two days without success.  This Clinical research associatewriter called the [facility] at 09:36 to ascertain whether pt receives supportive services from them; I spoke to BeardenMadan.  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  Her phone number is [phone].  At 09:30 I called her; call rolled to voice mail, and I left a message.  I have subsequently called a second time.  As of this writing, return call is pending.  Pt's nurse, Donnal DebarRandi, has been notified.  Doylene Canninghomas Akanksha Bellmore, MA Triage Specialist [phone]   Addendum:  At 10:50 I called back to the [facility] and spoke to Moorearli.  At this time they report that care coordination services have been discontinued for this pt.  They agree to look into faxing psychometric testing to me.  Later Toula Moosarli called back, reporting that Tocarro's supervisor is Patty [phone]).  I called her, and she confirms that pt no longer receives care coordination services.  She will look into sending pt's psychometric testing to me by fax at [phone].  Doylene Canninghomas Laynee Lockamy, [place] Behavioral Health Coordinator [phone]   Addendum:  Stacy Gardnerrin Davenport, LCSW has spoken to Roxana HiresMichelle Juchatz, and ascertained that she currently serves as pt's guardian.  Pt receives no outpatient services and no other supportive services at this time.  He may live with his sister in [place].  [facility] [place], or with [facility] [place].  This information has been included in pt's discharge instructions.  Pt would also benefit from seeing Peer Support Specialists; they will be asked to  speak to pt.  Pt's nurse, Donnal DebarRandi, has been notified.  Doylene Canninghomas Felice Hope, [place] Behavioral Health Coordinator [phone]

## 2017-07-27 NOTE — Consult Note (Addendum)
[facility] Psychiatry Consult   Reason for Consult:  Suicidal ideations Referring Physician:  EDP Patient Identification: Flem Enderle MRN:  [id number] Principal Diagnosis: Adjustment disorder with depressed mood Diagnosis:   Patient Active Problem List   Diagnosis Date Noted  . Adjustment disorder with depressed mood [F43.21] 07/27/2017    Priority: High    Total Time spent with patient: 45 minutes  Subjective:   Charbel Los is a 27 y.o. male patient does not warrant admission. HPI:  27 yo male who presented to the ED with depression, anxiety, and anger issues.  This has been going on for the past 2 years since his grandmother died.  Difficulty controlling himself when provoked.  Calm and cooperative in the ED, denies suicidal/homicidal ideations, hallucinations, and withdrawal symptoms.  His guardian was contacted as he is stable for discharge.  Past Psychiatric History: depressed  Risk to Self: Suicidal Ideation: No Suicidal Intent: No Is patient at risk for suicide?: No Suicidal Plan?: No Access to Means: No What has been your use of drugs/alcohol within the last 12 months?: marijuna How many times?: 0 Other Self Harm Risks: NA Triggers for Past Attempts: None known Intentional Self Injurious Behavior: None Risk to Others: Homicidal Ideation: No Thoughts of Harm to Others: No Current Homicidal Intent: No Current Homicidal Plan: No Access to Homicidal Means: No Identified Victim: NA History of harm to others?: No Assessment of Violence: None Noted Violent Behavior Description: NA Does patient have access to weapons?: No Criminal Charges Pending?: No Does patient have a court date: No Prior Inpatient Therapy: Prior Inpatient Therapy: Yes Prior Therapy Dates: unknown Prior Therapy Facilty/Provider(s): unknown Reason for Treatment: bipolar Prior Outpatient Therapy: Prior Outpatient Therapy: Yes Prior Therapy Dates: unknown Prior Therapy Facilty/Provider(s):  [facility] Reason for Treatment: bipolar Does patient have an ACCT team?: No Does patient have Intensive In-House Services?  : No Does patient have [facility] services? : No Does patient have [facility] services?: No  Past Medical History:  Past Medical History:  Diagnosis Date  . ADD (attention deficit disorder)    History reviewed. No pertinent surgical history. Family History: No family history on file. Family Psychiatric  History: none Social History:  Social History   Substance and Sexual Activity  Alcohol Use Yes   Comment: 3-4 cans of 4 locos daily, sometimes more     Social History   Substance and Sexual Activity  Drug Use Yes  . Types: Marijuana   Comment: occasionally    Social History   Socioeconomic History  . Marital status: Single    Spouse name: Not on file  . Number of children: Not on file  . Years of education: Not on file  . Highest education level: Not on file  Occupational History  . Not on file  Social Needs  . Financial resource strain: Not on file  . Food insecurity:    Worry: Not on file    Inability: Not on file  . Transportation needs:    Medical: Not on file    Non-medical: Not on file  Tobacco Use  . Smoking status: Current Every Day Smoker    Packs/day: 1.00    Types: Cigarettes  . Smokeless tobacco: Never Used  Substance and Sexual Activity  . Alcohol use: Yes    Comment: 3-4 cans of 4 locos daily, sometimes more  . Drug use: Yes    Types: Marijuana    Comment: occasionally  . Sexual activity: Not on file  Lifestyle  .  Physical activity:    Days per week: Not on file    Minutes per session: Not on file  . Stress: Not on file  Relationships  . Social connections:    Talks on phone: Not on file    Gets together: Not on file    Attends religious service: Not on file    Active member of club or organization: Not on file    Attends meetings of clubs or organizations: Not on file    Relationship status: Not on file   Other Topics Concern  . Not on file  Social History Narrative  . Not on file   Additional Social History: N/A    Allergies:   Allergies  Allergen Reactions  . Shellfish Allergy Anaphylaxis    Labs:  Results for orders placed or performed during the hospital encounter of 07/26/17 (from the past 48 hour(s))  Rapid urine drug screen (hospital performed)     Status: None   Collection Time: 07/26/17  2:25 PM  Result Value Ref Range   Opiates NONE DETECTED NONE DETECTED   Cocaine NONE DETECTED NONE DETECTED   Benzodiazepines NONE DETECTED NONE DETECTED   Amphetamines NONE DETECTED NONE DETECTED   Tetrahydrocannabinol NONE DETECTED NONE DETECTED   Barbiturates NONE DETECTED NONE DETECTED    Comment: (NOTE) DRUG SCREEN FOR MEDICAL PURPOSES ONLY.  IF CONFIRMATION IS NEEDED FOR ANY PURPOSE, NOTIFY LAB WITHIN 5 DAYS. LOWEST DETECTABLE LIMITS FOR URINE DRUG SCREEN Drug Class                     Cutoff (ng/mL) Amphetamine and metabolites    1000 Barbiturate and metabolites    200 Benzodiazepine                 097 Tricyclics and metabolites     300 Opiates and metabolites        300 Cocaine and metabolites        300 THC                            50 Performed at [facility], [place] [address], [place], [place] [address]   Comprehensive metabolic panel     Status: Abnormal   Collection Time: 07/26/17  2:30 PM  Result Value Ref Range   Sodium 141 135 - 145 mmol/L   Potassium 3.9 3.5 - 5.1 mmol/L   Chloride 108 101 - 111 mmol/L   CO2 28 22 - 32 mmol/L   Glucose, Bld 149 (H) 65 - 99 mg/dL   BUN 9 6 - 20 mg/dL   Creatinine, Ser 0.66 0.61 - 1.24 mg/dL   Calcium 9.3 8.9 - 10.3 mg/dL   Total Protein 6.8 6.5 - 8.1 g/dL   Albumin 3.7 3.5 - 5.0 g/dL   AST 27 15 - 41 U/L   ALT 22 17 - 63 U/L   Alkaline Phosphatase 62 38 - 126 U/L   Total Bilirubin 0.3 0.3 - 1.2 mg/dL   GFR calc non Af Amer >60 >60 mL/min   GFR calc Af Amer >60 >60 mL/min    Comment:  (NOTE) The eGFR has been calculated using the CKD EPI equation. This calculation has not been validated in all clinical situations. eGFR's persistently <60 mL/min signify possible Chronic Kidney Disease.    Anion gap 5 5 - 15    Comment: Performed at [facility], [address] [place], [place]  [address]  Ethanol     Status: None   Collection Time: 07/26/17  2:30 PM  Result Value Ref Range   Alcohol, Ethyl (B) <10 <10 mg/dL    Comment:        LOWEST DETECTABLE LIMIT FOR SERUM ALCOHOL IS 10 mg/dL FOR MEDICAL PURPOSES ONLY Performed at [facility] [address], [place], [place] [address]   cbc     Status: Abnormal   Collection Time: 07/26/17  2:30 PM  Result Value Ref Range   WBC 3.4 (L) 4.0 - 10.5 K/uL   RBC 4.26 4.22 - 5.81 MIL/uL   Hemoglobin 12.9 (L) 13.0 - 17.0 g/dL   HCT 37.8 (L) 39.0 - 52.0 %   MCV 88.7 78.0 - 100.0 fL   MCH 30.3 26.0 - 34.0 pg   MCHC 34.1 30.0 - 36.0 g/dL   RDW 13.5 11.5 - 15.5 %   Platelets 265 150 - 400 K/uL    Comment: Performed at [facility], [place] [address] [place],  [address]    Current Facility-Administered Medications  Medication Dose Route Frequency Provider Last Rate Last Dose  . acetaminophen (TYLENOL) tablet 650 mg  650 mg Oral Q4H PRN Jacqlyn Larsen, PA-C      . divalproex (DEPAKOTE ER) 24 hr tablet 250 mg  250 mg Oral BID Ethelene Hal, NP   250 mg at 07/26/17 2125  . hydrOXYzine (ATARAX/VISTARIL) tablet 25 mg  25 mg Oral TID PRN Ethelene Hal, NP      . OLANZapine [facility]) tablet 5 mg  5 mg Oral QHS Ethelene Hal, NP   5 mg at 07/26/17 2125   No current outpatient medications on file.    Musculoskeletal: Strength & Muscle Tone: within normal limits Gait & Station: normal Patient leans: N/A  Psychiatric Specialty Exam: Physical Exam  Nursing note and vitals reviewed. Constitutional: He is oriented to person, place, and time. He  appears well-developed and well-nourished.  HENT:  Head: Normocephalic and atraumatic.  Neck: Normal range of motion.  Respiratory: Effort normal.  Musculoskeletal: Normal range of motion.  Neurological: He is alert and oriented to person, place, and time.  Psychiatric: His speech is normal and behavior is normal. Judgment and thought content normal. Cognition and memory are normal. He exhibits a depressed mood.    Review of Systems  Psychiatric/Behavioral: Positive for depression.  All other systems reviewed and are negative.   Blood pressure (!) 103/55, pulse 73, temperature 98.6 F (37 C), temperature source Oral, resp. rate 14, height 6' (1.829 m), weight 72.6 kg (160 lb), SpO2 96 %.Body mass index is 21.7 kg/m.  General Appearance: Casual  Eye Contact:  Good  Speech:  Normal Rate  Volume:  Normal  Mood:  Depressed, mild, situational  Affect:  Appropriate  Thought Process:  Coherent and Descriptions of Associations: Intact  Orientation:  Full (Time, Place, and Person)  Thought Content:  WDL and Logical  Suicidal Thoughts:  No  Homicidal Thoughts:  No  Memory:  Immediate;   Good Recent;   Good Remote;   Good  Judgement:  Fair  Insight:  Fair  Psychomotor Activity:  Normal  Concentration:  Concentration: Good and Attention Span: Good  Recall:  Good  Fund of Knowledge:  Fair  Language:  Good  Akathisia:  No  Handed:  Right  AIMS (if indicated):   N/A  Assets:  Leisure Time Physical Health Resilience Social Support  ADL's:  Intact  Cognition:  Impaired,  Mild  Sleep:   N/A     Treatment Plan Summary: Daily contact with patient to assess and evaluate symptoms and progress in treatment, Medication management and Plan adjustment disorder with depressed mood:  -Crisis stabilization -Medication management:  Restarted Depakote 250 mg BID for mood stabilization, Zyprexa 5 mg at bedtime for psychosis, and Hydroxyzine 25 mg TID PRN anxiety -Individual  counseling  Disposition: No evidence of imminent risk to self or others at present.    Waylan Boga, NP 07/27/2017 9:53 AM   Patient seen face-to-face for psychiatric evaluation, chart reviewed and case discussed with the physician extender and developed treatment plan. Reviewed the information documented and agree with the treatment plan.  Buford Dresser, DO 07/27/17 12:56 PM

## 2017-07-27 NOTE — Progress Notes (Addendum)
UPDATE 11:18AM: CSW spoke with Roxana HiresMichelle Juchatz, [phone] patients legal guardian, regarding discharge plans. Patient has recently been imprisoned from April 14, 2017 - June 15, 2017 and during that timeframe patients SSI stopped. Patient also no longer receives services with [facility] or any other outpatient provider. Legal guardian states patients residence is with sister in [place] however patient [place] the bus to [place] frequently. Legal guardian stated patient is able to leave hospital and make own decisions regarding living arrangements at this time. [facility] and RN has been notified.  CSW attempted to contact patients legal guardian, Roxana HiresMichelle Juchatz [phone], to update on discharge plans and to notify that [facility] however no answer. CSW will continue to reach out to legal guardian.   Stacy GardnerErin Mauria Asquith, [facility] Emergency Room Clinical Social Worker [phone]

## 2017-07-27 NOTE — Discharge Instructions (Signed)
For your behavioral health needs, you are advised to follow up with one of the following providers at your earliest opportunity:       [facility]      [address]      [place], [place] [address]      [phone]      New and returning patients are seen at their walk-in clinic.  Walk-in hours are Monday - Friday from 8:00 am - 3:00 pm.  Walk-in patients are seen on a first come, first served basis.  Try to arrive as early as possible for he best chance of being seen the same day.       [facility]      [address]      [place], [place] [address]      [phone]

## 2019-08-16 ENCOUNTER — Encounter (HOSPITAL_BASED_OUTPATIENT_CLINIC_OR_DEPARTMENT_OTHER): Payer: Self-pay

## 2019-08-16 ENCOUNTER — Emergency Department (HOSPITAL_BASED_OUTPATIENT_CLINIC_OR_DEPARTMENT_OTHER)
Admission: EM | Admit: 2019-08-16 | Discharge: 2019-08-16 | Disposition: A | Discharge status: Home / Self Care | Payer: Medicaid Other | Attending: Emergency Medicine | Admitting: Emergency Medicine

## 2019-08-16 ENCOUNTER — Other Ambulatory Visit: Payer: Self-pay

## 2019-08-16 ENCOUNTER — Emergency Department (HOSPITAL_BASED_OUTPATIENT_CLINIC_OR_DEPARTMENT_OTHER): Payer: Medicaid Other

## 2019-08-16 DIAGNOSIS — Y999 Unspecified external cause status: Secondary | ICD-10-CM | POA: Insufficient documentation

## 2019-08-16 DIAGNOSIS — Z23 Encounter for immunization: Secondary | ICD-10-CM | POA: Insufficient documentation

## 2019-08-16 DIAGNOSIS — S025XXA Fracture of tooth (traumatic), initial encounter for closed fracture: Secondary | ICD-10-CM

## 2019-08-16 DIAGNOSIS — S20212A Contusion of left front wall of thorax, initial encounter: Secondary | ICD-10-CM | POA: Diagnosis not present

## 2019-08-16 DIAGNOSIS — Z79899 Other long term (current) drug therapy: Secondary | ICD-10-CM | POA: Diagnosis not present

## 2019-08-16 DIAGNOSIS — S0990XA Unspecified injury of head, initial encounter: Secondary | ICD-10-CM | POA: Diagnosis present

## 2019-08-16 DIAGNOSIS — S0083XA Contusion of other part of head, initial encounter: Secondary | ICD-10-CM | POA: Diagnosis not present

## 2019-08-16 DIAGNOSIS — S1093XA Contusion of unspecified part of neck, initial encounter: Secondary | ICD-10-CM | POA: Diagnosis not present

## 2019-08-16 DIAGNOSIS — Y9389 Activity, other specified: Secondary | ICD-10-CM | POA: Diagnosis not present

## 2019-08-16 DIAGNOSIS — F17210 Nicotine dependence, cigarettes, uncomplicated: Secondary | ICD-10-CM | POA: Insufficient documentation

## 2019-08-16 DIAGNOSIS — Y9289 Other specified places as the place of occurrence of the external cause: Secondary | ICD-10-CM | POA: Insufficient documentation

## 2019-08-16 HISTORY — DX: Bipolar disorder, unspecified: F31.9

## 2019-08-16 HISTORY — DX: Depression, unspecified: F32.A

## 2019-08-16 MED ORDER — TETANUS-DIPHTH-ACELL PERTUSSIS 5-2.5-18.5 LF-MCG/0.5 IM SUSP
0.5000 mL | Freq: Once | INTRAMUSCULAR | Status: AC
  Administered 2019-08-16: 04:00:00 0.5 mL via INTRAMUSCULAR
  Filled 2019-08-16: qty 0.5

## 2019-08-16 MED ORDER — IBUPROFEN 400 MG PO TABS
400.0000 mg | ORAL_TABLET | Freq: Once | ORAL | Status: AC
  Administered 2019-08-16: 400 mg via ORAL
  Filled 2019-08-16: qty 1

## 2019-08-16 NOTE — ED Notes (Signed)
Sisters Luna Kitchens & Cherrell Fate only family members for pt info updates

## 2019-08-16 NOTE — ED Notes (Signed)
Patient's mouth and face cleaned with water. Pt given ice pack for mouth. Sister contacted to pick pt up.

## 2019-08-16 NOTE — Discharge Instructions (Signed)
Please see a dentist to address the injury to your teeth.  Apply ice to all sore areas as needed.  Take ibuprofen and/or acetaminophen as needed for pain.
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
## 2019-08-16 NOTE — ED Notes (Signed)
Sister called and updated on pt status.
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
## 2021-08-28 ENCOUNTER — Encounter (HOSPITAL_BASED_OUTPATIENT_CLINIC_OR_DEPARTMENT_OTHER): Payer: Self-pay

## 2021-08-28 ENCOUNTER — Other Ambulatory Visit: Payer: Self-pay

## 2021-08-28 ENCOUNTER — Emergency Department (HOSPITAL_BASED_OUTPATIENT_CLINIC_OR_DEPARTMENT_OTHER): Payer: Medicaid Other

## 2021-08-28 ENCOUNTER — Observation Stay (HOSPITAL_BASED_OUTPATIENT_CLINIC_OR_DEPARTMENT_OTHER)
Admission: EM | Admit: 2021-08-28 | Discharge: 2021-08-29 | Disposition: A | Discharge status: Home / Self Care | Payer: Medicaid Other | Attending: Internal Medicine | Admitting: Internal Medicine

## 2021-08-28 DIAGNOSIS — R4182 Altered mental status, unspecified: Secondary | ICD-10-CM | POA: Diagnosis not present

## 2021-08-28 DIAGNOSIS — I10 Essential (primary) hypertension: Secondary | ICD-10-CM | POA: Insufficient documentation

## 2021-08-28 DIAGNOSIS — X31XXXA Exposure to excessive natural cold, initial encounter: Secondary | ICD-10-CM | POA: Insufficient documentation

## 2021-08-28 DIAGNOSIS — R4189 Other symptoms and signs involving cognitive functions and awareness: Secondary | ICD-10-CM

## 2021-08-28 DIAGNOSIS — R404 Transient alteration of awareness: Secondary | ICD-10-CM | POA: Diagnosis present

## 2021-08-28 DIAGNOSIS — F319 Bipolar disorder, unspecified: Secondary | ICD-10-CM | POA: Diagnosis not present

## 2021-08-28 DIAGNOSIS — T405X1A Poisoning by cocaine, accidental (unintentional), initial encounter: Principal | ICD-10-CM

## 2021-08-28 DIAGNOSIS — F1490 Cocaine use, unspecified, uncomplicated: Secondary | ICD-10-CM | POA: Diagnosis not present

## 2021-08-28 DIAGNOSIS — R9431 Abnormal electrocardiogram [ECG] [EKG]: Secondary | ICD-10-CM | POA: Diagnosis present

## 2021-08-28 DIAGNOSIS — R739 Hyperglycemia, unspecified: Secondary | ICD-10-CM | POA: Diagnosis not present

## 2021-08-28 DIAGNOSIS — Z87891 Personal history of nicotine dependence: Secondary | ICD-10-CM | POA: Diagnosis not present

## 2021-08-28 DIAGNOSIS — T68XXXA Hypothermia, initial encounter: Secondary | ICD-10-CM | POA: Diagnosis present

## 2021-08-28 DIAGNOSIS — F1410 Cocaine abuse, uncomplicated: Secondary | ICD-10-CM

## 2021-08-28 HISTORY — DX: Essential (primary) hypertension: I10

## 2021-08-28 HISTORY — DX: Headache, unspecified: R51.9

## 2021-08-28 HISTORY — DX: Unspecified convulsions: R56.9

## 2021-08-28 HISTORY — DX: Prediabetes: R73.03

## 2021-08-28 LAB — AMMONIA: Ammonia: 26 umol/L (ref 9–35)

## 2021-08-28 LAB — URINALYSIS, ROUTINE W REFLEX MICROSCOPIC
Bilirubin Urine: NEGATIVE
Glucose, UA: 100 mg/dL — AB
Hgb urine dipstick: NEGATIVE
Ketones, ur: 15 mg/dL — AB
Leukocytes,Ua: NEGATIVE
Nitrite: NEGATIVE
Protein, ur: 30 mg/dL — AB
Specific Gravity, Urine: 1.038 — ABNORMAL HIGH (ref 1.005–1.030)
pH: 5.5 (ref 5.0–8.0)

## 2021-08-28 LAB — CBC WITH DIFFERENTIAL/PLATELET
Abs Immature Granulocytes: 0.03 10*3/uL (ref 0.00–0.07)
Basophils Absolute: 0 10*3/uL (ref 0.0–0.1)
Basophils Relative: 0 %
Eosinophils Absolute: 0.1 10*3/uL (ref 0.0–0.5)
Eosinophils Relative: 2 %
HCT: 40.1 % (ref 39.0–52.0)
Hemoglobin: 13.1 g/dL (ref 13.0–17.0)
Immature Granulocytes: 0 %
Lymphocytes Relative: 13 %
Lymphs Abs: 1 10*3/uL (ref 0.7–4.0)
MCH: 27.4 pg (ref 26.0–34.0)
MCHC: 32.7 g/dL (ref 30.0–36.0)
MCV: 83.9 fL (ref 80.0–100.0)
Monocytes Absolute: 0.5 10*3/uL (ref 0.1–1.0)
Monocytes Relative: 6 %
Neutro Abs: 6 10*3/uL (ref 1.7–7.7)
Neutrophils Relative %: 79 %
Platelets: 264 10*3/uL (ref 150–400)
RBC: 4.78 MIL/uL (ref 4.22–5.81)
RDW: 13.5 % (ref 11.5–15.5)
WBC: 7.6 10*3/uL (ref 4.0–10.5)
nRBC: 0 % (ref 0.0–0.2)

## 2021-08-28 LAB — RAPID URINE DRUG SCREEN, HOSP PERFORMED
Amphetamines: NOT DETECTED
Barbiturates: NOT DETECTED
Benzodiazepines: NOT DETECTED
Cocaine: POSITIVE — AB
Opiates: NOT DETECTED
Tetrahydrocannabinol: NOT DETECTED

## 2021-08-28 LAB — COMPREHENSIVE METABOLIC PANEL
ALT: 11 U/L (ref 0–44)
AST: 13 U/L — ABNORMAL LOW (ref 15–41)
Albumin: 4.2 g/dL (ref 3.5–5.0)
Alkaline Phosphatase: 71 U/L (ref 38–126)
Anion gap: 9 (ref 5–15)
BUN: 14 mg/dL (ref 6–20)
CO2: 28 mmol/L (ref 22–32)
Calcium: 9.6 mg/dL (ref 8.9–10.3)
Chloride: 101 mmol/L (ref 98–111)
Creatinine, Ser: 0.84 mg/dL (ref 0.61–1.24)
GFR, Estimated: >60 mL/min (ref >60)
Glucose, Bld: 197 mg/dL — ABNORMAL HIGH (ref 70–99)
Potassium: 3.5 mmol/L (ref 3.5–5.1)
Sodium: 138 mmol/L (ref 135–145)
Total Bilirubin: 0.3 mg/dL (ref 0.3–1.2)
Total Protein: 9 g/dL — ABNORMAL HIGH (ref 6.5–8.1)

## 2021-08-28 LAB — ETHANOL: Alcohol, Ethyl (B): <10 mg/dL (ref <10)

## 2021-08-28 LAB — CK: Total CK: 110 U/L (ref 49–397)

## 2021-08-28 LAB — TROPONIN I (HIGH SENSITIVITY)
Troponin I (High Sensitivity): <2 ng/L (ref <18)
Troponin I (High Sensitivity): 3 ng/L (ref <18)

## 2021-08-28 LAB — LACTIC ACID, PLASMA: Lactic Acid, Venous: 1.6 mmol/L (ref 0.5–1.9)

## 2021-08-28 LAB — SALICYLATE LEVEL: Salicylate Lvl: <7 mg/dL — ABNORMAL LOW (ref 7.0–30.0)

## 2021-08-28 LAB — CBG MONITORING, ED: Glucose-Capillary: 204 mg/dL — ABNORMAL HIGH (ref 70–99)

## 2021-08-28 LAB — VALPROIC ACID LEVEL: Valproic Acid Lvl: <10 ug/mL — ABNORMAL LOW (ref 50.0–100.0)

## 2021-08-28 LAB — ACETAMINOPHEN LEVEL: Acetaminophen (Tylenol), Serum: <10 ug/mL — ABNORMAL LOW (ref 10–30)

## 2021-08-28 MED ORDER — SODIUM CHLORIDE 0.9 % IV BOLUS (SEPSIS)
1000.0000 mL | Freq: Once | INTRAVENOUS | Status: AC
  Administered 2021-08-28: 1000 mL via INTRAVENOUS

## 2021-08-28 MED ORDER — ENOXAPARIN SODIUM 40 MG/0.4ML IJ SOSY
40.0000 mg | PREFILLED_SYRINGE | INTRAMUSCULAR | Status: DC
  Administered 2021-08-28: 40 mg via SUBCUTANEOUS
  Filled 2021-08-28: qty 0.4

## 2021-08-28 MED ORDER — SODIUM CHLORIDE 0.9% FLUSH
3.0000 mL | Freq: Two times a day (BID) | INTRAVENOUS | Status: DC
  Administered 2021-08-28 – 2021-08-29 (×2): 3 mL via INTRAVENOUS

## 2021-08-28 MED ORDER — LORAZEPAM 2 MG/ML IJ SOLN: 1.0000 mg | INTRAMUSCULAR | Status: DC | PRN

## 2021-08-28 MED ORDER — DIVALPROEX SODIUM ER 250 MG PO TB24
250.0000 mg | ORAL_TABLET | Freq: Two times a day (BID) | ORAL | Status: DC
  Administered 2021-08-28 – 2021-08-29 (×2): 250 mg via ORAL
  Filled 2021-08-28 (×2): qty 1

## 2021-08-28 MED ORDER — ACETAMINOPHEN 325 MG PO TABS: 650.0000 mg | ORAL_TABLET | Freq: Four times a day (QID) | ORAL | Status: DC | PRN

## 2021-08-28 MED ORDER — HYDROXYZINE HCL 25 MG PO TABS: 25.0000 mg | ORAL_TABLET | Freq: Three times a day (TID) | ORAL | Status: DC | PRN

## 2021-08-28 MED ORDER — OLANZAPINE 5 MG PO TABS
5.0000 mg | ORAL_TABLET | Freq: Every day | ORAL | Status: DC
  Administered 2021-08-28: 5 mg via ORAL
  Filled 2021-08-28: qty 1

## 2021-08-28 MED ORDER — ENSURE ENLIVE PO LIQD
237.0000 mL | Freq: Two times a day (BID) | ORAL | Status: DC
  Administered 2021-08-29: 237 mL via ORAL

## 2021-08-28 MED ORDER — DICLOFENAC SODIUM 1 % EX GEL
2.0000 g | Freq: Four times a day (QID) | CUTANEOUS | Status: DC
  Administered 2021-08-28 – 2021-08-29 (×2): 2 g via TOPICAL
  Filled 2021-08-28: qty 100

## 2021-08-28 MED ORDER — ALBUTEROL SULFATE (2.5 MG/3ML) 0.083% IN NEBU: 2.5000 mg | INHALATION_SOLUTION | Freq: Four times a day (QID) | RESPIRATORY_TRACT | Status: DC | PRN

## 2021-08-28 MED ORDER — ACETAMINOPHEN 650 MG RE SUPP: 650.0000 mg | Freq: Four times a day (QID) | RECTAL | Status: DC | PRN

## 2021-08-28 NOTE — H&P (Signed)
?History and Physical  ? ? ?Patient: Jonathon House GMW:[id number] DOB: 1990/11/04 ?DOA: 08/28/2021 ?DOS: the patient was seen and examined on 08/28/2021 ?PCP: Patient, No Pcp Per (Inactive)  ?Patient coming from: Via private vehicle ? ?Chief Complaint:  ?Chief Complaint  ?Patient presents with  ? Abnormal ECG  ? ?HPI: Jonathon House is a 31 y.o. male with medical history significant of hypertension, prediabetes, bipolar 1 disorder, ADHD, and seizure disorder who presents after being found unresponsive.  Sometime around midnight this morning patient had gone to take a shower.  However, his sister noted that it was taking him a long time to go check on him and noted that he was on the floor locking the door still fully closed unresponsive.  He was breathing very shallowly at the time and they were able to pull him out of the bathroom.  No seizure-like activity was witnessed.  They called 911 who instructed them to start compressions and they did this until EMS arrived.  EMS took over compressions and after 5 compressions or so patient woke up and was gasping for air.  EKG was told to have been abnormal, but the patient refused transport at that time.  His sisters were able to convince him to come to the hospital thereafter.  His sisters are present at bedside report that the patient does not smoke anymore, drink, or do any illicit drugs to their knowledge.  He had been complaining of a headache earlier in the day and someone he knew had given him a pill. Reportedly had given him a pill he has a known history of seizures, but has actually been off of any antiseizure medicine for approximately a month as he had not been able to afford them.  He was previously on olanzapine and Depakote.  He reports being in need of a new primary care provider as well. ? ?In the ED patient was noted to be actively vomiting and diaphoretic.  He was initially hypotensive thermic with temperature of 93.1 ?F and was placed on [employer]  Blood  pressures ranged 113/84- 170/101, and all other vital signs were relatively within normal limits.  Labs were relatively unremarkable except glucose 197.  Urinalysis noted elevated specific gravity with rare bacteria seen but no other significant signs of infection urine drug screen was positive for cocaine. ? ?Review of Systems: As mentioned in the history of present illness. All other systems reviewed and are negative. ?Past Medical History:  ?Diagnosis Date  ? ADD (attention deficit disorder)   ? Bipolar 1 disorder (HCC)   ? Borderline diabetes   ? Depression   ? Headache   ? Hypertension   ? Seizures (HCC)   ? ?History reviewed. No pertinent surgical history. ?Social History:  reports that he has quit smoking. His smoking use included cigarettes. He has never used smokeless tobacco. He reports that he does not currently use alcohol. He reports that he does not currently use drugs after having used the following drugs: Marijuana. ? ?Allergies  ?Allergen Reactions  ? Shellfish Allergy Anaphylaxis  ? ? ?History reviewed. No pertinent family history. ? ?Prior to Admission medications   ?Medication Sig Start Date End Date Taking? Authorizing Provider  ?divalproex (DEPAKOTE ER) 250 MG 24 hr tablet Take 1 tablet (250 mg total) by mouth 2 (two) times daily. 07/27/17   Charm Rings, NP  ?hydrOXYzine (ATARAX/VISTARIL) 25 MG tablet Take 1 tablet (25 mg total) by mouth 3 (three) times daily as needed for anxiety. 07/27/17  Charm RingsLord, Jamison Y, NP  ?OLANZapine (ZYPREXA) 5 MG tablet Take 1 tablet (5 mg total) by mouth at bedtime. 07/27/17   Charm RingsLord, Jamison Y, NP  ? ? ?Physical Exam: ?Vitals:  ? 08/28/21 1200 08/28/21 1330 08/28/21 1354 08/28/21 1503  ?BP: 116/62 128/62    ?Pulse: 71 76  70  ?Resp: 13 10    ?Temp:   98.3 ?F (36.8 ?C) 98 ?F (36.7 ?C)  ?TempSrc:   Oral Oral  ?SpO2: 97% 92%    ?Weight:      ? ? ?Constitutional: Thin young male currently in no acute distress ?Eyes: PERRL, lids and conjunctivae normal ?ENMT: Mucous  membranes are moist.  Patient reports that he bit his tongue some.  Poor dentition. ?Neck: normal, supple, no masses, no thyromegaly ?Respiratory: clear to auscultation bilaterally, no wheezing, no crackles. Normal respiratory effort. No accessory muscle use.  ?Cardiovascular: Regular rate and rhythm, no murmurs / rubs / gallops. No extremity edema.  Tenderness palpation of the chest wall. ?Abdomen: no tenderness,  No hepatosplenomegaly. Bowel sounds positive.  ?Musculoskeletal: no clubbing / cyanosis. No joint deformity upper and lower extremities. Good ROM, no contractures. Normal muscle tone.  ?Skin: no rashes, lesions, ulcers.   ?Neurologic: CN 2-12 grossly intact.  Strength 5/5 in all 4.  ?Psychiatric: Fair judgment and insight. Alert and oriented x 3. mood.  ? ?Data Reviewed: ? ?EKG revealed normal sinus rhythm with libre personal at 87 bpm with right axis deviation ? ?Assessment and Plan: ?Episode of unresponsiveness secondary to accidental/unintentional cocaine overdose ?Patient presents after being found unresponsive with shallow breathing for which one of his sisters started CPR until EMS arrived.  UDS was positive for cocaine.  He had been reportedly given a pill by  someone he knew as he had been complaining of headache earlier which is thought to be how he had cocaine in his system.  EKG without significant ischemic changes and high-sensitivity troponins negative.  Not witnessed having any seizure-like activity once found.  Suspect possibly unintentional cocaine overdose as a cause of symptoms although ?-Admit to a progressive bed ?-Voltaren gel to chest wall ?-Follow-up telemetry overnight ? ?Hypothermia ?Resolved.  Patient's initial temperature was 93.1 ?F temperature improved after being placed on [employer].  White blood cell count and lactic acid levels within normal limits.  No other signs of infection appreciated. ? ?Seizure disorder ?Patient reported to have history of seizure disorder, but  cannot find any documentation regarding seizures.  It appears he had been off of any medications for at least a month now.  Prior home medication regimen includes Depakote 250 mg twice daily which can also be used to treat migraine headaches. ?-Seizure precautions ?-Resume Depakote ?-Ativan IV as needed seizure ? ?Bipolar disorder ?Prior Home medication regimen includes olanzapine 5 mg nightly ?-Resume olanzapine ? ?Anxiety ?-Hydroxyzine 25 mg 3 times daily as needed for anxiety ? ?Hyperglycemia ?Acute.  Initial glucose elevated at 197.  Suspect this is secondary to acute stress response although patient reportedly is borderline diabetic. ?-Check hemoglobin A1c in a.m ? ? ? Advance Care Planning:   Code Status: Full Code   ? ?Consults: None ? ?Family Communication: Patient's sisters updated at bedside ? ?Severity of Illness: ?The appropriate patient status for this patient is OBSERVATION. Observation status is judged to be reasonable and necessary in order to provide the required intensity of service to ensure the patient's safety. The patient's presenting symptoms, physical exam findings, and initial radiographic and laboratory data in the context  of their medical condition is felt to place them at decreased risk for further clinical deterioration. Furthermore, it is anticipated that the patient will be medically stable for discharge from the hospital within 2 midnights of admission.  ? ?Author: ?Clydie Braun, MD ?08/28/2021 4:55 PM ? ?For on call review [email or web address].  ?

## 2021-08-28 NOTE — ED Notes (Addendum)
Md made aware of rectal temp 93.29F // bair hugger applied  ?

## 2021-08-28 NOTE — ED Notes (Signed)
Handoff report given to [facility] 

## 2021-08-28 NOTE — ED Notes (Signed)
RT placed PIV in right [facility] without difficulty. Labs drawn from PIV post placement. PIV flushed, secured, and saline locked.  ?

## 2021-08-28 NOTE — ED Notes (Signed)
Handoff report given to Tarva RN on 5N at [facility] ?
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
## 2021-08-28 NOTE — ED Provider Notes (Signed)
?[facility] EMERGENCY DEPT ?Provider Note ? ? ?CSN: [id number] ?Arrival date & time: 08/28/21  0110 ? ?  ? ?History ? ?Chief Complaint  ?Patient presents with  ? Abnormal ECG  ?Level  5 caveat due to altered mental status ? ?Jonathon House is a 31 y.o. male. ? ?The history is provided by the patient and a relative. The history is limited by the condition of the patient.  ?Altered Mental Status ?Presenting symptoms: unresponsiveness   ?Severity:  Severe ?Most recent episode:  Today ?Progression:  Improving ?Chronicity:  New ?Patient presents with family for unresponsiveness.  Sister who is at bedside provides most the history ?Patient was in his usual state of health per sister.  He reported he was getting in the shower and she noted after several minutes he was not getting out.  When she went to check on him she could not open the door because he was blocking it while he was on the floor.  She found him fully clothed  in the floor  unresponsive.  She reports that he had very shallow breathing after calling 911 he she was instructed to start CPR compressions.  This was continued for up to 5 minutes until EMS arrived and patient began to wake up. ?EMS performed EKG and told him that it appeared abnormal but he refused transport.  After paramedics left the sister was able to convince him to come to the ER ?Patient has known history of seizures but there was no seizure activity ?  ?Patient arrived to the ED actively vomiting and diaphoretic ?Home Medications ?Prior to Admission medications   ?Medication Sig Start Date End Date Taking? Authorizing Provider  ?divalproex (DEPAKOTE ER) 250 MG 24 hr tablet Take 1 tablet (250 mg total) by mouth 2 (two) times daily. 07/27/17   Charm Rings, NP  ?hydrOXYzine (ATARAX/VISTARIL) 25 MG tablet Take 1 tablet (25 mg total) by mouth 3 (three) times daily as needed for anxiety. 07/27/17   Charm Rings, NP  ?OLANZapine (ZYPREXA) 5 MG tablet Take 1 tablet (5 mg total) by  mouth at bedtime. 07/27/17   Charm Rings, NP  ?   ? ?Allergies    ?Shellfish allergy   ? ?Review of Systems   ?Review of Systems  ?Unable to perform ROS: Mental status change  ? ?Physical Exam ?Updated Vital Signs ?BP 126/73   Pulse 75   Temp (!) 95.7 ?F (35.4 ?C)   Resp 12   Wt 53.1 kg   SpO2 95%   BMI 15.87 kg/m?  ?Physical Exam ?CONSTITUTIONAL: Disheveled, somnolent appears chronically ill ?HEAD: Normocephalic/atraumatic ?EYES: EOMI/PERRL pupils pinpoint bilaterally ?ENMT: Mucous membranes dry, poor dentition ?SPINE/BACK: No bruising/crepitance/stepoffs noted to spine ?CV: S1/S2 noted, no murmurs/rubs/gallops noted ?LUNGS: Lungs are clear to auscultation bilaterally, no apparent distress ?ABDOMEN: soft, nontender, nondistended ?NEURO: Pt is somnolent but arousable to voice.  He awakens and follows commands with no focal weakness noted.  Patient immediately goes back to sleep.  When he wakes up he answers questions appropriately and denies drug use ?EXTREMITIES: pulses normal/equal, full ROM, no deformities ?SKIN: Diaphoretic ?PSYCH: Unable to assess ? ?ED Results / Procedures / Treatments   ?Labs ?(all labs ordered are listed, but only abnormal results are displayed) ?Labs Reviewed  ?COMPREHENSIVE METABOLIC PANEL - Abnormal; Notable for the following components:  ?    Result Value  ? Glucose, Bld 197 (*)   ? Total Protein 9.0 (*)   ? AST 13 (*)   ? All  other components within normal limits  ?SALICYLATE LEVEL - Abnormal; Notable for the following components:  ? Salicylate Lvl <7.0 (*)   ? All other components within normal limits  ?ACETAMINOPHEN LEVEL - Abnormal; Notable for the following components:  ? Acetaminophen (Tylenol), Serum <10 (*)   ? All other components within normal limits  ?RAPID URINE DRUG SCREEN, HOSP PERFORMED - Abnormal; Notable for the following components:  ? Cocaine POSITIVE (*)   ? All other components within normal limits  ?URINALYSIS, ROUTINE W REFLEX MICROSCOPIC - Abnormal;  Notable for the following components:  ? Specific Gravity, Urine 1.038 (*)   ? Glucose, UA 100 (*)   ? Ketones, ur 15 (*)   ? Protein, ur 30 (*)   ? Bacteria, UA RARE (*)   ? All other components within normal limits  ?VALPROIC ACID LEVEL - Abnormal; Notable for the following components:  ? Valproic Acid Lvl <10 (*)   ? All other components within normal limits  ?CBG MONITORING, ED - Abnormal; Notable for the following components:  ? Glucose-Capillary 204 (*)   ? All other components within normal limits  ?ETHANOL  ?CBC WITH DIFFERENTIAL/PLATELET  ?CK  ?LACTIC ACID, PLASMA  ?AMMONIA  ?HIV ANTIBODY (ROUTINE TESTING W REFLEX)  ?TROPONIN I (HIGH SENSITIVITY)  ?TROPONIN I (HIGH SENSITIVITY)  ? ? ?EKG ?EKG Interpretation ? ?Date/Time:  Sunday August 28 2021 01:21:10 EDT ?Ventricular Rate:  87 ?PR Interval:  144 ?QRS Duration: 92 ?QT Interval:  392 ?QTC Calculation: 471 ?R Axis:   93 ?Text Interpretation: ** Suspect arm lead reversal, interpretation assumes no reversal Normal sinus rhythm Right atrial enlargement Rightward axis Pulmonary disease pattern Abnormal ECG No previous ECGs available Confirmed by Zadie RhineWickline, Dawson Hollman ([id number]) on 08/28/2021 1:24:05 AM ? ?Radiology ?CT HEAD WO CONTRAST ? ?Result Date: 08/28/2021 ?CLINICAL DATA:  Found unresponsive, possible fall EXAM: CT HEAD WITHOUT CONTRAST CT CERVICAL SPINE WITHOUT CONTRAST TECHNIQUE: Multidetector CT imaging of the head and cervical spine was performed following the standard protocol without intravenous contrast. Multiplanar CT image reconstructions of the cervical spine were also generated. RADIATION DOSE REDUCTION: This exam was performed according to the departmental dose-optimization program which includes automated exposure control, adjustment of the mA and/or kV according to patient size and/or use of iterative reconstruction technique. COMPARISON:  None. FINDINGS: CT HEAD FINDINGS Brain: No evidence of acute infarction, hemorrhage, hydrocephalus, extra-axial  collection or mass lesion/mass effect. Vascular: No hyperdense vessel or unexpected calcification. Skull: Normal. Negative for fracture or focal lesion. Sinuses/Orbits: No acute finding. Other: None. CT CERVICAL SPINE FINDINGS Alignment: Within normal limits. Skull base and vertebrae: 7 cervical segments are well visualized. Vertebral body height is well maintained. Prior fragmented osteophyte is noted along the inferior aspect of C5 with well corticated margins consistent with prior trauma. No acute fracture or acute facet abnormality is noted. Soft tissues and spinal canal: No prevertebral fluid or swelling. No visible canal hematoma. Upper chest: Lung apices are within normal limits. Other: None IMPRESSION: CT of the head: No acute intracranial abnormality noted. CT of the cervical spine: Mild degenerative change without acute abnormality. Electronically Signed   By: Alcide CleverMark  Lukens M.D.   On: 08/28/2021 02:42  ? ?CT Cervical Spine Wo Contrast ? ?Result Date: 08/28/2021 ?CLINICAL DATA:  Found unresponsive, possible fall EXAM: CT HEAD WITHOUT CONTRAST CT CERVICAL SPINE WITHOUT CONTRAST TECHNIQUE: Multidetector CT imaging of the head and cervical spine was performed following the standard protocol without intravenous contrast. Multiplanar CT image reconstructions of the cervical spine  were also generated. RADIATION DOSE REDUCTION: This exam was performed according to the departmental dose-optimization program which includes automated exposure control, adjustment of the mA and/or kV according to patient size and/or use of iterative reconstruction technique. COMPARISON:  None. FINDINGS: CT HEAD FINDINGS Brain: No evidence of acute infarction, hemorrhage, hydrocephalus, extra-axial collection or mass lesion/mass effect. Vascular: No hyperdense vessel or unexpected calcification. Skull: Normal. Negative for fracture or focal lesion. Sinuses/Orbits: No acute finding. Other: None. CT CERVICAL SPINE FINDINGS Alignment: Within  normal limits. Skull base and vertebrae: 7 cervical segments are well visualized. Vertebral body height is well maintained. Prior fragmented osteophyte is noted along the inferior aspect of C5 with well corticated

## 2021-08-28 NOTE — ED Notes (Signed)
Md made aware of re-checked temp 93.54F ?

## 2021-08-28 NOTE — ED Notes (Signed)
Attempt to call sister to update.  No answer ?

## 2021-08-28 NOTE — ED Notes (Addendum)
Sister Dale Amherst would like to be called if brother is going to be admitted and room is assigned so she can know where to visit him. ?

## 2021-08-28 NOTE — ED Notes (Addendum)
MD made aware of temp 95.68F rectal // MD requested that pt be removed from bair hugger  ? ? ?

## 2021-08-28 NOTE — Progress Notes (Signed)
  [facility] will assume care on arrival to accepting facility. Until arrival, care as per EDP. However, [facility] available 24/7 for questions and assistance.   Nursing staff please page [facility] Admits and Consults ([phone]) as soon as the patient arrives to the hospital.  Walaa Carel, DO [facility]  

## 2021-08-29 ENCOUNTER — Other Ambulatory Visit (HOSPITAL_COMMUNITY): Payer: Self-pay

## 2021-08-29 ENCOUNTER — Encounter: Payer: Self-pay | Admitting: Infectious Disease

## 2021-08-29 DIAGNOSIS — R4189 Other symptoms and signs involving cognitive functions and awareness: Secondary | ICD-10-CM | POA: Diagnosis not present

## 2021-08-29 LAB — BASIC METABOLIC PANEL
Anion gap: 7 (ref 5–15)
BUN: 7 mg/dL (ref 6–20)
CO2: 27 mmol/L (ref 22–32)
Calcium: 8.5 mg/dL — ABNORMAL LOW (ref 8.9–10.3)
Chloride: 104 mmol/L (ref 98–111)
Creatinine, Ser: 0.78 mg/dL (ref 0.61–1.24)
GFR, Estimated: >60 mL/min (ref >60)
Glucose, Bld: 96 mg/dL (ref 70–99)
Potassium: 3.5 mmol/L (ref 3.5–5.1)
Sodium: 138 mmol/L (ref 135–145)

## 2021-08-29 LAB — CBC
HCT: 36.5 % — ABNORMAL LOW (ref 39.0–52.0)
Hemoglobin: 12 g/dL — ABNORMAL LOW (ref 13.0–17.0)
MCH: 27.8 pg (ref 26.0–34.0)
MCHC: 32.9 g/dL (ref 30.0–36.0)
MCV: 84.7 fL (ref 80.0–100.0)
Platelets: 213 10*3/uL (ref 150–400)
RBC: 4.31 MIL/uL (ref 4.22–5.81)
RDW: 13.6 % (ref 11.5–15.5)
WBC: 3.9 10*3/uL — ABNORMAL LOW (ref 4.0–10.5)
nRBC: 0 % (ref 0.0–0.2)

## 2021-08-29 LAB — HEMOGLOBIN A1C
Hgb A1c MFr Bld: 5.8 % — ABNORMAL HIGH (ref 4.8–5.6)
Mean Plasma Glucose: 119.76 mg/dL

## 2021-08-29 LAB — HIV ANTIBODY (ROUTINE TESTING W REFLEX): HIV Screen 4th Generation wRfx: REACTIVE — AB

## 2021-08-29 LAB — MRSA NEXT GEN BY PCR, NASAL: MRSA by PCR Next Gen: NOT DETECTED

## 2021-08-29 MED ORDER — HYDROXYZINE HCL 25 MG PO TABS
25.0000 mg | ORAL_TABLET | Freq: Three times a day (TID) | ORAL | 0 refills | Status: DC | PRN
  Filled 2021-08-29: qty 30, 10d supply, fill #0

## 2021-08-29 MED ORDER — OLANZAPINE 5 MG PO TABS
5.0000 mg | ORAL_TABLET | Freq: Every day | ORAL | 2 refills | Status: DC
  Filled 2021-08-29: qty 30, 30d supply, fill #0

## 2021-08-29 MED ORDER — DIVALPROEX SODIUM ER 250 MG PO TB24
250.0000 mg | ORAL_TABLET | Freq: Two times a day (BID) | ORAL | 2 refills | Status: DC
  Filled 2021-08-29: qty 60, 30d supply, fill #0

## 2021-08-29 NOTE — Plan of Care (Signed)

## 2021-08-29 NOTE — Progress Notes (Signed)
CSW confirmed with pt that he is discharging to his sister's home. ?Daleen Squibb, MSW, LCSW ?5/1/202310:47 AM  ?

## 2021-08-29 NOTE — Progress Notes (Signed)
?   08/29/21 1115  ?Clinical Encounter Type  ?Visited With Patient not available;Health care provider  ?Visit Type Initial  ?Referral From Nurse ?Marthe Patch, RN)  ?Consult/Referral To Chaplain ?Gelene Mink [place])  ? ?Spiritual Consultation request for Prayer. Patient already discharged upon arrival. ?[address] [place], Aletha Halim., [phone]  ?

## 2021-08-29 NOTE — Plan of Care (Signed)
  Problem: Education: Goal: Knowledge of General Education information will improve Description: Including pain rating scale, medication(s)/side effects and non-pharmacologic comfort measures Outcome: Progressing   Problem: Health Behavior/Discharge Planning: Goal: Ability to manage health-related needs will improve Outcome: Progressing   Problem: Clinical Measurements: Goal: Will remain free from infection Outcome: Progressing   

## 2021-08-29 NOTE — Discharge Summary (Signed)
?Physician Discharge Summary  ?Jonathon House MWU:[id number] DOB: 03/31/1991 DOA: 08/28/2021 ? ?PCP: Patient, No Pcp Per (Inactive) ? ?Admit date: 08/28/2021 ?Discharge date: 08/29/2021 ? ?Admitted From: Home ?Disposition: Home ? ?Recommendations for Outpatient Follow-up:  ?Follow up with PCP in 1-2 weeks; needs to establish care ?Refilled home olanzapine, Depakote, hydroxyzine ?Continue to encourage substance abuse cessation, cocaine ? ?Home Health: None identified by PT ?Equipment/Devices: None ? ?Discharge Condition: Stable ?CODE STATUS: Full code ?Diet recommendation: Regular diet ? ?History of present illness: ? ?Jonathon House is a 31 y.o. male with medical history significant of hypertension, prediabetes, bipolar 1 disorder, ADHD, and seizure disorder who presents after being found unresponsive.  Sometime around midnight this morning patient had gone to take a shower.  However, his sister noted that it was taking him a long time to go check on him and noted that he was on the floor locking the door still fully closed unresponsive.  He was breathing very shallowly at the time and they were able to pull him out of the bathroom.  No seizure-like activity was witnessed.  They called 911 who instructed them to start compressions and they did this until EMS arrived.  EMS took over compressions and after 5 compressions or so patient woke up and was gasping for air.  EKG was told to have been abnormal, but the patient refused transport at that time.  His sisters were able to convince him to come to the hospital thereafter.  His sisters are present at bedside report that the patient does not smoke anymore, drink, or do any illicit drugs to their knowledge.  He had been complaining of a headache earlier in the day and someone he knew had given him a pill. Reportedly had given him a pill he has a known history of seizures, but has actually been off of any antiseizure medicine for approximately a month as he had not been able to  afford them.  He was previously on olanzapine and Depakote.  He reports being in need of a new primary care provider as well. ?  ?In the ED patient was noted to be actively vomiting and diaphoretic.  He was initially hypotensive thermic with temperature of 93.1 ?F and was placed on [employer]  Blood pressures ranged 113/84- 170/101, and all other vital signs were relatively within normal limits.  Labs were relatively unremarkable except glucose 197.  Urinalysis noted elevated specific gravity with rare bacteria seen but no other significant signs of infection urine drug screen was positive for cocaine. ? ?Hospital course: ? ?Episode of unresponsiveness secondary to accidental/unintentional cocaine overdose ?Patient presents after being found unresponsive with shallow breathing for which one of his sisters started CPR until EMS arrived.  UDS was positive for cocaine.  He had been reportedly given a pill by  someone he knew as he had been complaining of headache earlier which is thought to be how he had cocaine in his system.  EKG without significant ischemic changes and high-sensitivity troponins negative.  Not witnessed having any seizure-like activity once found.  Suspect possibly unintentional cocaine overdose as a cause of symptoms although.  Patient now back to his baseline.  No arrhythmias noted on telemetry past 24 hours.  Counseled on need for cessation of illicit substance, given cocaine positive on UDS.  Outpatient follow-up. ?  ?Hypothermia: Resolved ?Patient's initial temperature was 93.1 ?F temperature improved after being placed on [employer]  White blood cell count and lactic acid levels within normal limits.  No other signs of infection appreciated. ?  ?Seizure disorder ?Patient reported to have history of seizure disorder, but cannot find any documentation regarding seizures.  It appears he had been off of any medications for at least a month now.  Prior home medication regimen includes Depakote  250 mg twice daily which can also be used to treat migraine headaches.  Refill of Depakote given at time of discharge.  Outpatient follow-up. ?  ?Bipolar disorder ?Refilled home olanzapine 5 mg nightly ? ?Anxiety ?Hydroxyzine 25 mg 3 times daily as needed for anxiety ?  ?Hyperglycemia ?Initial glucose elevated at 197.  Suspect this is secondary to acute stress response although patient reportedly is borderline diabetic.  Hemoglobin A1c 5.8. ? ? ? ? ? ? ?Discharge Diagnoses:  ?Principal Problem: ?  Episode of unresponsiveness ?Active Problems: ?  Cocaine overdose, accidental or unintentional, initial encounter (HCC) ?  Hypothermia ?  Hyperglycemia ?  Bipolar disorder, unspecified (HCC) ? ? ? ?Discharge Instructions ? ?Discharge Instructions   ? ? Call MD for:  difficulty breathing, headache or visual disturbances   Complete by: As directed ?  ? Call MD for:  extreme fatigue   Complete by: As directed ?  ? Call MD for:  persistant dizziness or light-headedness   Complete by: As directed ?  ? Call MD for:  persistant nausea and vomiting   Complete by: As directed ?  ? Call MD for:  severe uncontrolled pain   Complete by: As directed ?  ? Call MD for:  temperature >100.4   Complete by: As directed ?  ? Diet - low sodium heart healthy   Complete by: As directed ?  ? Increase activity slowly   Complete by: As directed ?  ? ?  ? ?Allergies as of 08/29/2021   ? ?   Reactions  ? Shellfish Allergy Anaphylaxis  ? ?  ? ?  ?Medication List  ?  ? ?TAKE these medications   ? ?acetaminophen 500 MG tablet ?Commonly known as: TYLENOL ?Take 500 mg by mouth every 6 (six) hours as needed for mild pain. ?  ?divalproex 250 MG 24 hr tablet ?Commonly known as: DEPAKOTE ER ?Take 1 tablet (250 mg total) by mouth 2 (two) times daily. ?  ?hydrOXYzine 25 MG tablet ?Commonly known as: ATARAX ?Take 1 tablet (25 mg total) by mouth 3 (three) times daily as needed for anxiety. ?  ?OLANZapine 5 MG tablet ?Commonly known as: ZYPREXA ?Take 1 tablet (5 mg  total) by mouth at bedtime. ?  ? ?  ? ? ?Allergies  ?Allergen Reactions  ? Shellfish Allergy Anaphylaxis  ? ? ?Consultations: ?None ? ? ?Procedures/Studies: ?CT HEAD WO CONTRAST ? ?Result Date: 08/28/2021 ?CLINICAL DATA:  Found unresponsive, possible fall EXAM: CT HEAD WITHOUT CONTRAST CT CERVICAL SPINE WITHOUT CONTRAST TECHNIQUE: Multidetector CT imaging of the head and cervical spine was performed following the standard protocol without intravenous contrast. Multiplanar CT image reconstructions of the cervical spine were also generated. RADIATION DOSE REDUCTION: This exam was performed according to the departmental dose-optimization program which includes automated exposure control, adjustment of the mA and/or kV according to patient size and/or use of iterative reconstruction technique. COMPARISON:  None. FINDINGS: CT HEAD FINDINGS Brain: No evidence of acute infarction, hemorrhage, hydrocephalus, extra-axial collection or mass lesion/mass effect. Vascular: No hyperdense vessel or unexpected calcification. Skull: Normal. Negative for fracture or focal lesion. Sinuses/Orbits: No acute finding. Other: None. CT CERVICAL SPINE FINDINGS Alignment: Within normal limits. Skull base and vertebrae:  7 cervical segments are well visualized. Vertebral body height is well maintained. Prior fragmented osteophyte is noted along the inferior aspect of C5 with well corticated margins consistent with prior trauma. No acute fracture or acute facet abnormality is noted. Soft tissues and spinal canal: No prevertebral fluid or swelling. No visible canal hematoma. Upper chest: Lung apices are within normal limits. Other: None IMPRESSION: CT of the head: No acute intracranial abnormality noted. CT of the cervical spine: Mild degenerative change without acute abnormality. Electronically Signed   By: Alcide Clever M.D.   On: 08/28/2021 02:42  ? ?CT Cervical Spine Wo Contrast ? ?Result Date: 08/28/2021 ?CLINICAL DATA:  Found unresponsive,  possible fall EXAM: CT HEAD WITHOUT CONTRAST CT CERVICAL SPINE WITHOUT CONTRAST TECHNIQUE: Multidetector CT imaging of the head and cervical spine was performed following the standard protocol without i

## 2021-08-29 NOTE — Evaluation (Signed)
Physical Therapy Evaluation ?Patient Details ?Name: Jonathon House ?MRN: [id number] ?DOB: 18-Mar-1991 ?Today's Date: 08/29/2021 ? ?History of Present Illness ? Pt is a 31 y/o male admitted secondary to unresponsive episode from unintentional overdose. PMH includes substance abuse, bipolar disorder, HTN, ADHD, and seizures.  ?Clinical Impression ? Pt admitted secondary to problem above with deficits below. Pt overall at a mod I to supervision level. Mild unsteadiness, but no overt LOB noted. Pt reports being close to baseline. Reports sister can assist if needed at d/c. No further skilled PT needs at this time. Will sign off. If needs change, please re-consult.    ?   ? ?Recommendations for follow up therapy are one component of a multi-disciplinary discharge planning process, led by the attending physician.  Recommendations may be updated based on patient status, additional functional criteria and insurance authorization. ? ?Follow Up Recommendations No PT follow up ? ?  ?Assistance Recommended at Discharge Intermittent Supervision/Assistance  ?Patient can return home with the following ? Assist for transportation;Assistance with cooking/housework ? ?  ?Equipment Recommendations None recommended by PT  ?Recommendations for Other Services ?    ?  ?Functional Status Assessment Patient has had a recent decline in their functional status and demonstrates the ability to make significant improvements in function in a reasonable and predictable amount of time.  ? ?  ?Precautions / Restrictions Precautions ?Precautions: Fall ?Restrictions ?Weight Bearing Restrictions: No  ? ?  ? ?Mobility ? Bed Mobility ?Overal bed mobility: Modified Independent ?  ?  ?  ?  ?  ?  ?  ?  ? ?Transfers ?Overall transfer level: Needs assistance ?Equipment used: None ?Transfers: Sit to/from Stand ?Sit to Stand: Supervision ?  ?  ?  ?  ?  ?General transfer comment: Supervision for safety. Increased time to come to standing. ?   ? ?Ambulation/Gait ?Ambulation/Gait assistance: Supervision ?Gait Distance (Feet): 150 Feet ?Assistive device: None ?Gait Pattern/deviations: Step-through pattern, Decreased stride length ?Gait velocity: Decreased ?  ?  ?General Gait Details: Supervision for safety. Mild unsteadiness, but no overt LOB noted. Pt reports feeling close to baseline. ? ?Stairs ?  ?  ?  ?  ?  ? ?Wheelchair Mobility ?  ? ?Modified Rankin (Stroke Patients Only) ?  ? ?  ? ?Balance Overall balance assessment: Mild deficits observed, not formally tested ?  ?  ?  ?  ?  ?  ?  ?  ?  ?  ?  ?  ?  ?  ?  ?  ?  ?  ?   ? ? ? ?Pertinent Vitals/Pain Pain Assessment ?Pain Assessment: No/denies pain  ? ? ?Home Living Family/patient expects to be discharged to:: Other (Comment) (hotel) ?Living Arrangements: Other relatives;Non-relatives/Friends ?Available Help at Discharge: Friend(s);Family;Available 24 hours/day ?Type of Home: Other(Comment) (hotel) ?Home Access: Level entry ?  ?  ?  ?Home Layout: One level ?Home Equipment: None ?   ?  ?Prior Function Prior Level of Function : Independent/Modified Independent ?  ?  ?  ?  ?  ?  ?  ?  ?  ? ? ?Hand Dominance  ?   ? ?  ?Extremity/Trunk Assessment  ? Upper Extremity Assessment ?Upper Extremity Assessment: Overall WFL for tasks assessed ?  ? ?Lower Extremity Assessment ?Lower Extremity Assessment: Overall WFL for tasks assessed ?  ? ?Cervical / Trunk Assessment ?Cervical / Trunk Assessment: Normal  ?Communication  ? Communication: No difficulties  ?Cognition Arousal/Alertness: Awake/alert ?Behavior During Therapy: [facility] for tasks assessed/performed ?Overall  Cognitive Status: No family/caregiver present to determine baseline cognitive functioning ?  ?  ?  ?  ?  ?  ?  ?  ?  ?  ?  ?  ?  ?  ?  ?  ?General Comments: Slower processing, but may be close to baseline. ?  ?  ? ?  ?General Comments General comments (skin integrity, edema, etc.): No family present ? ?  ?Exercises    ? ?Assessment/Plan  ?  ?PT Assessment  Patient does not need any further PT services  ?PT Problem List   ? ?   ?  ?PT Treatment Interventions     ? ?PT Goals (Current goals can be found in the Care Plan section)  ?Acute Rehab PT Goals ?Patient Stated Goal: to go home ?PT Goal Formulation: With patient ?Time For Goal Achievement: 08/29/21 ?Potential to Achieve Goals: Good ? ?  ?Frequency   ?  ? ? ?Co-evaluation   ?  ?  ?  ?  ? ? ?  ?AM-PAC PT "6 Clicks" Mobility  ?Outcome Measure Help needed turning from your back to your side while in a flat bed without using bedrails?: None ?Help needed moving from lying on your back to sitting on the side of a flat bed without using bedrails?: None ?Help needed moving to and from a bed to a chair (including a wheelchair)?: None ?Help needed standing up from a chair using your arms (e.g., wheelchair or bedside chair)?: None ?Help needed to walk in hospital room?: None ?Help needed climbing 3-5 steps with a railing? : A Little ?6 Click Score: 23 ? ?  ?End of Session Equipment Utilized During Treatment: Gait belt ?Activity Tolerance: Patient tolerated treatment well ?Patient left: in bed;with call bell/phone within reach;with bed alarm set ?Nurse Communication: Mobility status ?PT Visit Diagnosis: Other abnormalities of gait and mobility (R26.89) ?  ? ?Time: 0850-0900 ?PT Time Calculation (min) (ACUTE ONLY): 10 min ? ? ?Charges:   PT Evaluation ?$PT Eval Low Complexity: 1 Low ?  ?  ?   ? ? ?Farley Ly, PT, DPT  ?Acute Rehabilitation Services  ?Pager: [phone] ?Office: [phone] ? ? ?Jonathon House ?08/29/2021, 9:52 AM ? ?

## 2021-08-30 LAB — HIV-1/2 AB - DIFFERENTIATION
HIV 1 Ab: REACTIVE
HIV 2 Ab: NONREACTIVE

## 2021-08-31 ENCOUNTER — Telehealth: Payer: Self-pay

## 2021-08-31 NOTE — Telephone Encounter (Signed)
Newly reactive HIV-1 antibody. Referral faxed to [facility] to assist with notification and linkage to care.  ? ?Beryle Flock, RN ? ?

## 2021-09-12 NOTE — Hospital Course (Signed)
[facility] note, [facility] called ?

## 2021-09-22 ENCOUNTER — Emergency Department (HOSPITAL_COMMUNITY): Payer: Medicaid Other

## 2021-09-22 ENCOUNTER — Encounter (HOSPITAL_COMMUNITY): Payer: Self-pay | Admitting: Emergency Medicine

## 2021-09-22 ENCOUNTER — Emergency Department (HOSPITAL_COMMUNITY)
Admission: EM | Admit: 2021-09-22 | Discharge: 2021-09-22 | Disposition: A | Discharge status: Home / Self Care | Payer: Medicaid Other | Attending: Emergency Medicine | Admitting: Emergency Medicine

## 2021-09-22 DIAGNOSIS — M79605 Pain in left leg: Secondary | ICD-10-CM | POA: Diagnosis not present

## 2021-09-22 DIAGNOSIS — R519 Headache, unspecified: Secondary | ICD-10-CM | POA: Insufficient documentation

## 2021-09-22 MED ORDER — ACETAMINOPHEN 325 MG PO TABS
650.0000 mg | ORAL_TABLET | Freq: Once | ORAL | Status: AC
  Administered 2021-09-22: 650 mg via ORAL
  Filled 2021-09-22: qty 2

## 2021-09-22 MED ORDER — IBUPROFEN 800 MG PO TABS: 800.0000 mg | ORAL_TABLET | Freq: Three times a day (TID) | ORAL | 0 refills | Status: AC

## 2021-09-22 NOTE — Discharge Instructions (Addendum)
Your ct and xray were normal. I am discharging you with medication to treat your pain.  Return for any new or worsening conditions
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
## 2021-11-06 ENCOUNTER — Emergency Department (HOSPITAL_COMMUNITY)
Admission: EM | Admit: 2021-11-06 | Discharge: 2021-11-06 | Disposition: A | Discharge status: Home / Self Care | Payer: Medicaid Other | Attending: Emergency Medicine | Admitting: Emergency Medicine

## 2021-11-06 ENCOUNTER — Encounter (HOSPITAL_COMMUNITY): Payer: Self-pay

## 2021-11-06 ENCOUNTER — Emergency Department (HOSPITAL_COMMUNITY): Payer: Medicaid Other

## 2021-11-06 DIAGNOSIS — T405X1A Poisoning by cocaine, accidental (unintentional), initial encounter: Secondary | ICD-10-CM | POA: Insufficient documentation

## 2021-11-06 DIAGNOSIS — Y900 Blood alcohol level of less than 20 mg/100 ml: Secondary | ICD-10-CM | POA: Insufficient documentation

## 2021-11-06 DIAGNOSIS — R400 Somnolence: Secondary | ICD-10-CM | POA: Insufficient documentation

## 2021-11-06 DIAGNOSIS — T50901A Poisoning by unspecified drugs, medicaments and biological substances, accidental (unintentional), initial encounter: Secondary | ICD-10-CM

## 2021-11-06 LAB — CBC
HCT: 35.6 % — ABNORMAL LOW (ref 39.0–52.0)
Hemoglobin: 11.7 g/dL — ABNORMAL LOW (ref 13.0–17.0)
MCH: 27.9 pg (ref 26.0–34.0)
MCHC: 32.9 g/dL (ref 30.0–36.0)
MCV: 84.8 fL (ref 80.0–100.0)
Platelets: 274 10*3/uL (ref 150–400)
RBC: 4.2 MIL/uL — ABNORMAL LOW (ref 4.22–5.81)
RDW: 14.1 % (ref 11.5–15.5)
WBC: 3.9 10*3/uL — ABNORMAL LOW (ref 4.0–10.5)
nRBC: 0 % (ref 0.0–0.2)

## 2021-11-06 LAB — RAPID URINE DRUG SCREEN, HOSP PERFORMED
Amphetamines: NOT DETECTED
Barbiturates: NOT DETECTED
Benzodiazepines: NOT DETECTED
Cocaine: POSITIVE — AB
Opiates: NOT DETECTED
Tetrahydrocannabinol: NOT DETECTED

## 2021-11-06 LAB — COMPREHENSIVE METABOLIC PANEL
ALT: 15 U/L (ref 0–44)
AST: 15 U/L (ref 15–41)
Albumin: 3.1 g/dL — ABNORMAL LOW (ref 3.5–5.0)
Alkaline Phosphatase: 68 U/L (ref 38–126)
Anion gap: 8 (ref 5–15)
BUN: 6 mg/dL (ref 6–20)
CO2: 26 mmol/L (ref 22–32)
Calcium: 8.9 mg/dL (ref 8.9–10.3)
Chloride: 106 mmol/L (ref 98–111)
Creatinine, Ser: 0.89 mg/dL (ref 0.61–1.24)
GFR, Estimated: >60 mL/min (ref >60)
Glucose, Bld: 96 mg/dL (ref 70–99)
Potassium: 3.4 mmol/L — ABNORMAL LOW (ref 3.5–5.1)
Sodium: 140 mmol/L (ref 135–145)
Total Bilirubin: 0.5 mg/dL (ref 0.3–1.2)
Total Protein: 7.5 g/dL (ref 6.5–8.1)

## 2021-11-06 LAB — SALICYLATE LEVEL: Salicylate Lvl: <7 mg/dL — ABNORMAL LOW (ref 7.0–30.0)

## 2021-11-06 LAB — ETHANOL: Alcohol, Ethyl (B): <10 mg/dL (ref <10)

## 2021-11-06 LAB — ACETAMINOPHEN LEVEL: Acetaminophen (Tylenol), Serum: <10 ug/mL — ABNORMAL LOW (ref 10–30)

## 2021-11-06 MED ORDER — SODIUM CHLORIDE 0.9 % IV BOLUS
1000.0000 mL | Freq: Once | INTRAVENOUS | Status: AC
Start: 2021-11-06 — End: 2021-11-06
  Administered 2021-11-06: 1000 mL via INTRAVENOUS
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
## 2021-11-06 NOTE — Discharge Instructions (Signed)
Please avoid doing any drugs  See your doctor for follow  Return to ER if you have another overdose, thoughts of harming yourself or others

## 2021-12-24 ENCOUNTER — Emergency Department (HOSPITAL_COMMUNITY): Payer: Medicaid Other

## 2021-12-24 ENCOUNTER — Encounter (HOSPITAL_COMMUNITY): Payer: Self-pay

## 2021-12-24 ENCOUNTER — Emergency Department (HOSPITAL_COMMUNITY)
Admission: EM | Admit: 2021-12-24 | Discharge: 2021-12-24 | Disposition: A | Discharge status: Home / Self Care | Payer: Medicaid Other | Attending: Emergency Medicine | Admitting: Emergency Medicine

## 2021-12-24 DIAGNOSIS — R569 Unspecified convulsions: Secondary | ICD-10-CM

## 2021-12-24 DIAGNOSIS — S0993XA Unspecified injury of face, initial encounter: Secondary | ICD-10-CM | POA: Diagnosis present

## 2021-12-24 DIAGNOSIS — G40909 Epilepsy, unspecified, not intractable, without status epilepticus: Secondary | ICD-10-CM | POA: Diagnosis not present

## 2021-12-24 DIAGNOSIS — S00511A Abrasion of lip, initial encounter: Secondary | ICD-10-CM | POA: Diagnosis not present

## 2021-12-24 DIAGNOSIS — Z79899 Other long term (current) drug therapy: Secondary | ICD-10-CM | POA: Insufficient documentation

## 2021-12-24 LAB — COMPREHENSIVE METABOLIC PANEL
ALT: 14 U/L (ref 0–44)
AST: 20 U/L (ref 15–41)
Albumin: 3.7 g/dL (ref 3.5–5.0)
Alkaline Phosphatase: 65 U/L (ref 38–126)
Anion gap: 10 (ref 5–15)
BUN: 11 mg/dL (ref 6–20)
CO2: 21 mmol/L — ABNORMAL LOW (ref 22–32)
Calcium: 9.3 mg/dL (ref 8.9–10.3)
Chloride: 108 mmol/L (ref 98–111)
Creatinine, Ser: 1.06 mg/dL (ref 0.61–1.24)
GFR, Estimated: >60 mL/min (ref >60)
Glucose, Bld: 72 mg/dL (ref 70–99)
Potassium: 3.5 mmol/L (ref 3.5–5.1)
Sodium: 139 mmol/L (ref 135–145)
Total Bilirubin: 0.5 mg/dL (ref 0.3–1.2)
Total Protein: 8.4 g/dL — ABNORMAL HIGH (ref 6.5–8.1)

## 2021-12-24 LAB — CBC WITH DIFFERENTIAL/PLATELET
Abs Immature Granulocytes: 0.01 10*3/uL (ref 0.00–0.07)
Basophils Absolute: 0 10*3/uL (ref 0.0–0.1)
Basophils Relative: 0 %
Eosinophils Absolute: 0 10*3/uL (ref 0.0–0.5)
Eosinophils Relative: 1 %
HCT: 34.7 % — ABNORMAL LOW (ref 39.0–52.0)
Hemoglobin: 11.3 g/dL — ABNORMAL LOW (ref 13.0–17.0)
Immature Granulocytes: 0 %
Lymphocytes Relative: 22 %
Lymphs Abs: 0.9 10*3/uL (ref 0.7–4.0)
MCH: 27.7 pg (ref 26.0–34.0)
MCHC: 32.6 g/dL (ref 30.0–36.0)
MCV: 85 fL (ref 80.0–100.0)
Monocytes Absolute: 0.3 10*3/uL (ref 0.1–1.0)
Monocytes Relative: 8 %
Neutro Abs: 2.9 10*3/uL (ref 1.7–7.7)
Neutrophils Relative %: 69 %
Platelets: 195 10*3/uL (ref 150–400)
RBC: 4.08 MIL/uL — ABNORMAL LOW (ref 4.22–5.81)
RDW: 14.7 % (ref 11.5–15.5)
WBC: 4.2 10*3/uL (ref 4.0–10.5)
nRBC: 0 % (ref 0.0–0.2)

## 2021-12-24 LAB — CBG MONITORING, ED: Glucose-Capillary: 97 mg/dL (ref 70–99)

## 2021-12-24 LAB — VALPROIC ACID LEVEL: Valproic Acid Lvl: <10 ug/mL — ABNORMAL LOW (ref 50.0–100.0)

## 2021-12-24 MED ORDER — DIVALPROEX SODIUM ER 250 MG PO TB24: 250.0000 mg | ORAL_TABLET | Freq: Two times a day (BID) | ORAL | 2 refills | Status: AC

## 2021-12-24 MED ORDER — HYDROXYZINE HCL 25 MG PO TABS: 25.0000 mg | ORAL_TABLET | Freq: Three times a day (TID) | ORAL | 0 refills | Status: AC | PRN

## 2021-12-24 MED ORDER — OLANZAPINE 5 MG PO TABS: 5.0000 mg | ORAL_TABLET | Freq: Every day | ORAL | 2 refills | Status: AC

## 2021-12-24 MED ORDER — DIVALPROEX SODIUM ER 250 MG PO TB24
250.0000 mg | ORAL_TABLET | Freq: Once | ORAL | Status: AC
  Administered 2021-12-24: 250 mg via ORAL
  Filled 2021-12-24: qty 1

## 2021-12-24 NOTE — ED Notes (Signed)
Gave pt sandwich and a sprite before sending him out.
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
## 2022-12-29 NOTE — Progress Notes (Signed)
Patient diagnosed with HIV 07/2021. Unsure if he has been notified of result. Referral sent to [facility] to assist with linkage to [facility] to establish care.   Sandie Ano, RN

## 2023-06-26 ENCOUNTER — Telehealth: Payer: Self-pay

## 2023-06-26 NOTE — Telephone Encounter (Signed)
 Patient diagnosed with HIV on 08/28/21 at emergency department. Has not engaged in care.   Currently incarcerated with [facility]. Called and spoke with their medical department. Scheduled him for new patient appointment 3/26. Per jail, he is aware of diagnosis.   Sandie Ano, RN

## 2023-07-18 ENCOUNTER — Other Ambulatory Visit (HOSPITAL_COMMUNITY): Payer: Self-pay

## 2023-07-18 ENCOUNTER — Telehealth: Payer: Self-pay

## 2023-07-18 NOTE — Telephone Encounter (Signed)
 Pharmacy Patient Advocate Encounter  Insurance verification completed.   The patient is insured through [place]   Ran test claim for [employer]. Currently a quantity of 30 is a 30 day supply and the co-pay is $0.00 . [place] $0.00 [place] $0.00  This test claim was processed through [facility]- copay amounts may vary at other pharmacies due to pharmacy/plan contracts, or as the patient moves through the different stages of their insurance plan.

## 2023-07-20 NOTE — Progress Notes (Deleted)
 HPI: Jonathon House is a 33 y.o. male who presents to the [facility] clinic today to initiate care for a newly diagnosed HIV infection.  Patient Active Problem List   Diagnosis Date Noted   Hypothermia 08/28/2021   Episode of unresponsiveness 08/28/2021   Cocaine overdose, accidental or unintentional, initial encounter (HCC) 08/28/2021   Bipolar disorder, unspecified (HCC) 08/28/2021   Hyperglycemia 08/28/2021   Adjustment disorder with depressed mood 07/27/2017    Patient's Medications  New Prescriptions   No medications on file  Previous Medications   ACETAMINOPHEN (TYLENOL) 500 MG TABLET    Take 500-1,000 mg by mouth every 6 (six) hours as needed for mild pain or headache.   DIVALPROEX (DEPAKOTE ER) 250 MG 24 HR TABLET    Take 1 tablet (250 mg total) by mouth 2 (two) times daily.   HYDROXYZINE (ATARAX) 25 MG TABLET    Take 1 tablet (25 mg total) by mouth 3 (three) times daily as needed for anxiety.   IBUPROFEN (ADVIL) 800 MG TABLET    Take 1 tablet (800 mg total) by mouth 3 (three) times daily.   OLANZAPINE (ZYPREXA) 5 MG TABLET    Take 1 tablet (5 mg total) by mouth at bedtime.  Modified Medications   No medications on file  Discontinued Medications   No medications on file    Allergies: Allergies  Allergen Reactions   Shellfish Allergy Anaphylaxis    Past Medical History: Past Medical History:  Diagnosis Date   ADD (attention deficit disorder)    Bipolar 1 disorder (HCC)    Borderline diabetes    Depression    Headache    Hypertension    Seizures (HCC)     Social History: Social History   Socioeconomic History   Marital status: Single    Spouse name: Not on file   Number of children: Not on file   Years of education: Not on file   Highest education level: Not on file  Occupational History   Not on file  Tobacco Use   Smoking status: Former    Types: Cigarettes   Smokeless tobacco: Never  Substance and Sexual Activity   Alcohol use: Not Currently    Drug use: Not Currently    Types: Marijuana   Sexual activity: Not on file  Other Topics Concern   Not on file  Social History Narrative   Not on file   Social Drivers of Health   Financial Resource Strain: Not on file  Food Insecurity: Not on file  Transportation Needs: Not on file  Physical Activity: Not on file  Stress: Not on file  Social Connections: Not on file    Labs: No results found for: "HIV1RNAQUANT", "HIV1RNAVL", "CD4TABS"  RPR and STI No results found for: "LABRPR", "RPRTITER"      No data to display          Hepatitis B No results found for: "HEPBSAB", "HEPBSAG", "HEPBCAB" Hepatitis C No results found for: "HEPCAB", "HCVRNAPCRQN" Hepatitis A No results found for: "HAV" Lipids: No results found for: "CHOL", "TRIG", "HDL", "CHOLHDL", "VLDL", "LDLCALC"  Current HIV Regimen: Treatment naive  Assessment: Jonathon House is here today to initiate care with Dr. Luciana Axe for his HIV infection. He was diagnosed in May 2023 and was not engaged in care. Recently released from [facility] prison and denies receiving medication while incarcerated. He is insured through [facility] with $0 copay for ***. Will start patient on ***.  Plan: Start ***  Counseled patient on ***  Margarite Gouge, PharmD, CPP, BCIDP, AAHIVP Clinical Pharmacist Practitioner Infectious Diseases Clinical Pharmacist [facility] 07/20/2023, 4:19 PM

## 2023-07-25 ENCOUNTER — Ambulatory Visit: Payer: Medicaid Other

## 2023-07-25 ENCOUNTER — Ambulatory Visit: Payer: Medicaid Other | Admitting: Internal Medicine

## 2023-07-25 ENCOUNTER — Ambulatory Visit: Payer: MEDICAID | Admitting: Pharmacist

## 2023-08-08 ENCOUNTER — Telehealth: Payer: Self-pay

## 2023-08-08 NOTE — Telephone Encounter (Signed)
 Called [facility] to schedule appointment, no answer. Left HIPAA compliant voicemail requesting callback.   He has been released from [facility].   Linna Hoff, BSN, RN

## 2023-08-08 NOTE — Telephone Encounter (Signed)
 Received call from caller stating she is Jonathon House's sister and received voicemail from our office. She attempted to get Jonathon House on the phone, but says he hung up. Did not discuss any of Jonathon House's personal or health information, asked her to please let Jonathon House know that our office has been trying to get in touch with him.   Linna Hoff, BSN, RN

## 2023-10-24 ENCOUNTER — Telehealth: Payer: Self-pay

## 2023-10-24 NOTE — Telephone Encounter (Signed)
 Tried [employer] via his sister's number. She states she's not with him right now and that last time she put him on the phone with our office he hung up.   Asked her to please let him know that our office has been trying to get in touch with him.   Did not discuss any private or health information.   Khamia Stambaugh, BSN, RN

## 2024-01-04 NOTE — Progress Notes (Signed)
 [facility] Referral Notice  Attempts to reach patient to engage in ID clinic care have not been successful. Patient is being referred to [facility] for assistance in engaging in care.   Jonathon House June 22, 1990 [address]  [place] [address]  Jonathon House was diagnosed with HIV 08/28/21. He was originally scheduled with [facility] while he was incarcerated with [facility]. Appears he was released prior to that appointment. RN has spoken with patient's sister, she has been unable to get [name] to return phone calls from the clinic. Unclear if his sister is aware of his diagnosis, have not discussed this with her.   Emergency contacts:  Sherrell Fate Sister [phone]   Dillon Rower Sister [phone]     Latest Reference Range & Units 08/28/21 02:23  HIV Screen 4th Generation wRfx Non Reactive  Reactive !  HIV 1 Ab Non Reactive  Reactive  HIV 2 Ab Non Reactive  Non Reactive  Note  SEE COMMENT !  !: Data is abnormal  Last HIV Viral Load: No results found for: HIV1RNAQUANT  Last CD4 Count: No results found for: CD4TABS   Last [facility] Visit: never seen  Medication Dispense History: N/A   Letter Mailed: no valid address   Duration of Service: 10 minutes  [name], BSN, Charity fundraiser
